# Patient Record
Sex: Female | Born: 1955 | Race: White | Hispanic: No | Smoking: Former smoker
Health system: Southern US, Community
[De-identification: ages and names within clinical notes are randomized; demographics above are authoritative.]

## PROBLEM LIST (undated history)

## (undated) DIAGNOSIS — M810 Age-related osteoporosis without current pathological fracture: Secondary | ICD-10-CM

## (undated) HISTORY — DX: Age-related osteoporosis without current pathological fracture: M81.0

## (undated) HISTORY — PX: NASAL SINUS SURGERY: SHX719

## (undated) HISTORY — PX: TUBAL LIGATION: SHX77

---

## 2015-01-16 ENCOUNTER — Telehealth: Payer: Self-pay | Admitting: Family Medicine

## 2015-03-07 ENCOUNTER — Ambulatory Visit (INDEPENDENT_AMBULATORY_CARE_PROVIDER_SITE_OTHER): Payer: BLUE CROSS/BLUE SHIELD

## 2015-03-07 ENCOUNTER — Ambulatory Visit (INDEPENDENT_AMBULATORY_CARE_PROVIDER_SITE_OTHER): Payer: BLUE CROSS/BLUE SHIELD | Admitting: Family Medicine

## 2015-03-07 ENCOUNTER — Encounter: Payer: Self-pay | Admitting: Family Medicine

## 2015-03-07 ENCOUNTER — Ambulatory Visit: Payer: BLUE CROSS/BLUE SHIELD

## 2015-03-07 VITALS — BP 106/65 | HR 60 | Temp 97.8°F | Ht 65.5 in | Wt 142.6 lb

## 2015-03-07 DIAGNOSIS — Z23 Encounter for immunization: Secondary | ICD-10-CM

## 2015-03-07 DIAGNOSIS — M79621 Pain in right upper arm: Secondary | ICD-10-CM

## 2015-03-07 DIAGNOSIS — M25529 Pain in unspecified elbow: Secondary | ICD-10-CM

## 2015-03-07 DIAGNOSIS — M79641 Pain in right hand: Secondary | ICD-10-CM

## 2015-03-07 DIAGNOSIS — M79602 Pain in left arm: Secondary | ICD-10-CM | POA: Diagnosis not present

## 2015-03-07 DIAGNOSIS — M79622 Pain in left upper arm: Secondary | ICD-10-CM

## 2015-03-07 DIAGNOSIS — M79601 Pain in right arm: Secondary | ICD-10-CM

## 2015-03-07 DIAGNOSIS — M79642 Pain in left hand: Secondary | ICD-10-CM
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
## 2015-03-11 ENCOUNTER — Telehealth: Payer: Self-pay | Admitting: Family Medicine

## 2015-03-11 NOTE — Telephone Encounter (Signed)
Pt would like to go to PT Please advise Please fax to [phone]

## 2015-03-11 NOTE — Telephone Encounter (Signed)
Stp and advised referral put in. Pt voiced understanding.

## 2015-03-11 NOTE — Telephone Encounter (Signed)
Please arrange for physical therapy

## 2015-03-14 ENCOUNTER — Ambulatory Visit (INDEPENDENT_AMBULATORY_CARE_PROVIDER_SITE_OTHER): Payer: BLUE CROSS/BLUE SHIELD | Admitting: Family Medicine

## 2015-03-14 ENCOUNTER — Ambulatory Visit (INDEPENDENT_AMBULATORY_CARE_PROVIDER_SITE_OTHER): Payer: BLUE CROSS/BLUE SHIELD

## 2015-03-14 ENCOUNTER — Encounter: Payer: Self-pay | Admitting: Family Medicine

## 2015-03-14 VITALS — BP 116/66 | HR 51 | Temp 97.2°F | Ht 65.5 in | Wt 147.0 lb

## 2015-03-14 DIAGNOSIS — M79603 Pain in arm, unspecified: Secondary | ICD-10-CM

## 2015-03-14 DIAGNOSIS — M79602 Pain in left arm: Principal | ICD-10-CM

## 2015-03-14 DIAGNOSIS — S134XXD Sprain of ligaments of cervical spine, subsequent encounter: Secondary | ICD-10-CM

## 2015-03-14 DIAGNOSIS — M545 Low back pain, unspecified: Secondary | ICD-10-CM

## 2015-03-14 DIAGNOSIS — M79601 Pain in right arm: Secondary | ICD-10-CM

## 2015-03-14 DIAGNOSIS — R202 Paresthesia of skin: Secondary | ICD-10-CM

## 2015-03-14 MED ORDER — HYDROCODONE-ACETAMINOPHEN 5-325 MG PO TABS: 1.0000 | ORAL_TABLET | Freq: Four times a day (QID) | ORAL | Status: DC | PRN
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
## 2015-03-17 ENCOUNTER — Telehealth: Payer: Self-pay | Admitting: Family Medicine

## 2015-03-17 NOTE — Telephone Encounter (Signed)
Stp and she says the hydrocodone made her very sick to her stomach and she is trying to find out the name of the pain medication she has taken in the past that didn't cause her to be sick. Pt states she will CB with the name of the medicine.
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
## 2015-03-21 ENCOUNTER — Encounter: Payer: Self-pay | Admitting: Family Medicine

## 2015-03-21 ENCOUNTER — Ambulatory Visit (INDEPENDENT_AMBULATORY_CARE_PROVIDER_SITE_OTHER): Payer: BLUE CROSS/BLUE SHIELD | Admitting: Family Medicine

## 2015-03-21 VITALS — BP 105/69 | HR 64 | Temp 97.4°F | Ht 66.0 in | Wt 140.0 lb

## 2015-03-21 DIAGNOSIS — S134XXD Sprain of ligaments of cervical spine, subsequent encounter: Secondary | ICD-10-CM | POA: Diagnosis not present

## 2015-03-21 DIAGNOSIS — M545 Low back pain, unspecified: Secondary | ICD-10-CM

## 2015-03-21 NOTE — Patient Instructions (Addendum)
Continue current plan of care.  Meds as on list below.  Start Physical therapy Limit activities to sedentary, but move around every hour to avoid stiffening. No lifting or bending activities.

## 2015-03-24 ENCOUNTER — Ambulatory Visit: Payer: BLUE CROSS/BLUE SHIELD | Attending: Family Medicine | Admitting: Physical Therapy

## 2015-03-24 DIAGNOSIS — M533 Sacrococcygeal disorders, not elsewhere classified: Secondary | ICD-10-CM | POA: Insufficient documentation

## 2015-03-24 DIAGNOSIS — M25512 Pain in left shoulder: Secondary | ICD-10-CM | POA: Insufficient documentation

## 2015-03-24 DIAGNOSIS — M25511 Pain in right shoulder: Secondary | ICD-10-CM | POA: Insufficient documentation

## 2015-03-24 DIAGNOSIS — M542 Cervicalgia: Secondary | ICD-10-CM
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
## 2015-03-26 ENCOUNTER — Other Ambulatory Visit: Payer: Self-pay

## 2015-03-26 ENCOUNTER — Telehealth: Payer: Self-pay | Admitting: Family Medicine

## 2015-03-26 ENCOUNTER — Ambulatory Visit: Payer: BLUE CROSS/BLUE SHIELD | Admitting: Physical Therapy

## 2015-03-26 ENCOUNTER — Encounter: Payer: Self-pay | Admitting: Physical Therapy

## 2015-03-26 DIAGNOSIS — M25511 Pain in right shoulder: Secondary | ICD-10-CM

## 2015-03-26 DIAGNOSIS — M25512 Pain in left shoulder: Secondary | ICD-10-CM

## 2015-03-26 DIAGNOSIS — M545 Low back pain: Secondary | ICD-10-CM

## 2015-03-26 DIAGNOSIS — M542 Cervicalgia: Secondary | ICD-10-CM | POA: Diagnosis not present

## 2015-03-26 NOTE — Therapy (Signed)
[facility] [place], [place], [address] Phone: [phone]   Fax:  [phone]  Physical Therapy Treatment  Patient Details  Name: Ashlee Maldonado MRN: [id number] Date of Birth: [birth year] Referring Provider: Claretta Fraise MD.  Encounter Date: 03/26/2015      PT End of Session - 03/26/15 0740    Visit Number 2   Number of Visits 16   Date for PT Re-Evaluation 05/19/15   PT Start Time 0737   PT Stop Time 0820   PT Time Calculation (min) 43 min   Activity Tolerance Patient tolerated treatment well   Behavior During Therapy [facility] for tasks assessed/performed      No past medical history on file.  Past Surgical History  Procedure Laterality Date  . Tubal ligation    . Nasal sinus surgery      There were no vitals filed for this visit.  Visit Diagnosis:  Neck pain  Bilateral shoulder pain      Subjective Assessment - 03/26/15 0733    Subjective States that chest and low back pain from position during previous treatment. Having more issues with low back.   Limitations House hold activities   Patient Stated Goals Get back to normal.   Currently in Pain? Yes   Pain Score 4    Pain Location Neck   Pain Orientation Right;Left   Pain Descriptors / Indicators Aching;Sore   Pain Onset 1 to 4 weeks ago   Pain Frequency Intermittent            [facility] PT Assessment - 03/26/15 0001    Assessment   Medical Diagnosis Bilateral shoulder pain.   Onset Date/Surgical Date 03/04/15   Next MD Visit 04/04/2015  [facility] Adult PT Treatment/Exercise - 03/26/15 0001    Exercises   Exercises Neck   Neck Exercises: Supine   Neck Retraction 15 reps   Neck Retraction Limitations Reported cervical pain   Upper Extremity D2 Flexion;20 reps;Theraband  x5 reps LUE   Theraband Level (UE D2) Level 1 (Yellow)   Other Supine Exercise chin tuck with B ER yellow band x30 reps   Modalities   Modalities Electrical Stimulation;Moist Heat   Moist Heat Therapy   Number Minutes Moist Heat 15 Minutes   Moist Heat Location Cervical   Electrical Stimulation   Electrical Stimulation Location B cervical paraspinals/ Levator Scapula   Electrical Stimulation Action Pre-Mod   Electrical Stimulation Parameters 80-150 Hz x15 min   Electrical Stimulation Goals Pain;Tone   Manual Therapy   Manual Therapy Myofascial release   Myofascial Release MFR to B Levator Scapula/ cervical paraspinals to decrease tightness and pain in sitting                PT Education - 03/26/15 0901    Education provided Yes   Education Details HEP- chin tuck in supine with pillow in supine   Person(s) Educated Patient   Methods Explanation;Verbal cues;Handout   Comprehension Verbalized understanding;Verbal cues required             PT Long Term Goals - 03/24/15 1012    PT LONG TERM GOAL #1   Title Independent with an HEP.   Time 8   Period Weeks   Status New   PT LONG TERM GOAL #2   Title Bilateral active cervical rotation= 80 degrees.   Time 8  Period Weeks   Status New   PT LONG TERM GOAL #3   Title Perform ADL's with pain not > 3/10.   Time 8   Period Weeks   Status New               Plan - 03/26/15 0813    Clinical Impression Statement Patient tolerated todays's treatment fairly well today although she experienced pain with chin tucks in supine with pillow and in anterior L Bicep where bruising is still present during D2 exercise. Completed all exercises with fairly good technique that will require further cuieng. Main compliant during treatment today was of low back pain per patient report. Pillow was placed under buttocks while patient was in supine to provide relief. Normal modalities response noted following removal of the modalities. Accepted new HEP regarding chin tuck into pillow with directions regarding technique and parameters without questions. Denied cervical pain  following today' s treatment.   Pt will benefit from skilled therapeutic intervention in order to improve on the following deficits Pain;Decreased range of motion   Rehab Potential Excellent   PT Frequency 2x / week   PT Duration 8 weeks   PT Treatment/Interventions ADLs/Self Care Home Management;Electrical Stimulation;Cryotherapy;Moist Heat;Therapeutic exercise;Therapeutic activities;Ultrasound;Patient/family education;Manual techniques;Passive range of motion   PT Next Visit Plan Mckenzie neck exercises; Modalities and STW/M to bilateral cervical musculature (levator scap release technique).   PT Home Exercise Plan Provide additional HEP next treatment.   Consulted and Agree with Plan of Care Patient        Problem List There are no active problems to display for this patient.   Wynelle Fanny, PTA 03/26/2015, 9:06 AM  [facility] [address] [place], [place], [address] Phone: [phone]   Fax:  [phone]  Name: Ashlee Maldonado MRN: [id number] Date of Birth: [birth year]

## 2015-03-26 NOTE — Patient Instructions (Signed)
AROM: Neck Extension    Bend head backward. Hold _3___ seconds. Repeat __25-30__ times per set. Do _1___ sets per session. Do __2-3__ sessions per day.  http://orth.exer.us/300   Copyright  VHI. All rights reserved.

## 2015-04-01 ENCOUNTER — Encounter: Payer: Self-pay | Admitting: Physical Therapy

## 2015-04-01 ENCOUNTER — Ambulatory Visit: Payer: BLUE CROSS/BLUE SHIELD | Admitting: Physical Therapy

## 2015-04-01 DIAGNOSIS — M542 Cervicalgia: Secondary | ICD-10-CM

## 2015-04-01 DIAGNOSIS — M25511 Pain in right shoulder: Secondary | ICD-10-CM

## 2015-04-01 DIAGNOSIS — M25512 Pain in left shoulder: Secondary | ICD-10-CM

## 2015-04-01 NOTE — Therapy (Signed)
[facility] [place], [place], [address] Phone: [phone]   Fax:  [phone]  Physical Therapy Treatment  Patient Details  Name: Ashlee Maldonado MRN: [id number] Date of Birth: [birth year] Referring Provider: Claretta Fraise MD.  Encounter Date: 04/01/2015      PT End of Session - 04/01/15 0733    Visit Number 3   Number of Visits 16   Date for PT Re-Evaluation 05/19/15   PT Start Time 0734   PT Stop Time 0820   PT Time Calculation (min) 46 min   Activity Tolerance Patient tolerated treatment well   Behavior During Therapy [facility] for tasks assessed/performed      No past medical history on file.  Past Surgical History  Procedure Laterality Date  . Tubal ligation    . Nasal sinus surgery      There were no vitals filed for this visit.  Visit Diagnosis:  Neck pain  Bilateral shoulder pain      Subjective Assessment - 04/01/15 0731    Subjective States that she is sore from being on the couch all weekend and was sick and vomiting all weekend.    Limitations House hold activities   Patient Stated Goals Get back to normal.   Currently in Pain? Yes   Pain Score 8    Pain Location Neck   Pain Orientation Right;Left   Pain Descriptors / Indicators Sore   Pain Onset 1 to 4 weeks ago            [facility] PT Assessment - 04/01/15 0001    Assessment   Medical Diagnosis Bilateral shoulder pain.   Onset Date/Surgical Date 03/04/15   Next MD Visit 04/04/2015   Precautions   Precautions None                     [facility] Adult PT Treatment/Exercise - 04/01/15 0001    Modalities   Modalities Electrical Stimulation;Moist Heat;Ultrasound   Moist Heat Therapy   Number Minutes Moist Heat 15 Minutes   Moist Heat Location Cervical   Electrical Stimulation   Electrical Stimulation Location B cervical paraspinals/ Levator Scapula   Electrical Stimulation Action IFC   Electrical Stimulation Parameters 80-150 hz  x15 min   Electrical Stimulation Goals Pain;Tone   Ultrasound   Ultrasound Location B UT/Levator Scapula/ cervical paraspinals   Ultrasound Parameters 1.5 w/cm2, 100%, 39mhz x10 min   Ultrasound Goals Pain   Manual Therapy   Manual Therapy Myofascial release   Myofascial Release MFR to B Upper Trapezius/Levator Scapula/ cervical paraspinals to decrease tightness and pain in sitting                     PT Long Term Goals - 04/01/15 0806    PT LONG TERM GOAL #1   Title Independent with an HEP.   Time 8   Period Weeks   Status Achieved   PT LONG TERM GOAL #2   Title Bilateral active cervical rotation= 80 degrees.   Time 8   Period Weeks   Status On-going   PT LONG TERM GOAL #3   Title Perform ADL's with pain not > 3/10.   Time 8   Period Weeks   Status On-going               Plan - 04/01/15 0810    Clinical Impression Statement Patient tolerated today's treatment fairly well although she noted she was more sore following a weekend battling  a virus/stomach bug. Conservative treatment was conducted today to allow patient to recover more from illness before initating more exercise since she was still weak. Continues to be tight throughout B UT/ Levator Scapula region into cervical paraspinals that was palpable during manual therapy. Normal modalities response noted following removal of the modalities. Experienced "feeling better" upon end of treatment.   Pt will benefit from skilled therapeutic intervention in order to improve on the following deficits Pain;Decreased range of motion   Rehab Potential Excellent   PT Frequency 2x / week   PT Duration 8 weeks   PT Treatment/Interventions ADLs/Self Care Home Management;Electrical Stimulation;Cryotherapy;Moist Heat;Therapeutic exercise;Therapeutic activities;Ultrasound;Patient/family education;Manual techniques;Passive range of motion   PT Next Visit Plan Mckenzie neck exercises; Modalities and STW/M to bilateral  cervical musculature (levator scap release technique).   PT Home Exercise Plan Provide additional HEP next treatment.   Consulted and Agree with Plan of Care Patient        Problem List There are no active problems to display for this patient.  [name], PTA 04/01/2015 9:27 AM  [facility] [address] [place], [place], [address] Phone: [phone]   Fax:  [phone]  Name: Ashlee Maldonado MRN: [id number] Date of Birth: 08/06/1955

## 2015-04-03 ENCOUNTER — Encounter: Payer: Self-pay | Admitting: Physical Therapy

## 2015-04-03 ENCOUNTER — Ambulatory Visit: Payer: BLUE CROSS/BLUE SHIELD | Admitting: Physical Therapy

## 2015-04-03 DIAGNOSIS — M542 Cervicalgia: Secondary | ICD-10-CM

## 2015-04-03 DIAGNOSIS — M25511 Pain in right shoulder: Secondary | ICD-10-CM

## 2015-04-03 DIAGNOSIS — M25512 Pain in left shoulder: Secondary | ICD-10-CM

## 2015-04-03 NOTE — Therapy (Signed)
[facility] [place], [place], [address] Phone: [phone]   Fax:  [phone]  Physical Therapy Treatment  Patient Details  Name: Ashlee Maldonado MRN: [id number] Date of Birth: [birth year] Referring Provider: Claretta Fraise MD.  Encounter Date: 04/03/2015      PT End of Session - 04/03/15 0807    Visit Number 4   Number of Visits 16   Date for PT Re-Evaluation 05/19/15   PT Start Time 0734   PT Stop Time 0820   PT Time Calculation (min) 46 min   Activity Tolerance Patient tolerated treatment well   Behavior During Therapy [facility] for tasks assessed/performed      No past medical history on file.  Past Surgical History  Procedure Laterality Date  . Tubal ligation    . Nasal sinus surgery      There were no vitals filed for this visit.  Visit Diagnosis:  Neck pain  Bilateral shoulder pain      Subjective Assessment - 04/03/15 0806    Subjective Reports she cannot get comfortable at night sleeping and didn't sleep well last night. Reports some numbness and tingling.   Limitations House hold activities   Patient Stated Goals Get back to normal.   Currently in Pain? Yes   Pain Score 4    Pain Location Neck   Pain Orientation Right;Left   Pain Descriptors / Indicators Sore;Aching   Pain Onset 1 to 4 weeks ago            [facility] PT Assessment - 04/03/15 0001    Assessment   Medical Diagnosis Bilateral shoulder pain.   Onset Date/Surgical Date 03/04/15   Next MD Visit 04/04/2015   Precautions   Precautions None                     [facility] Adult PT Treatment/Exercise - 04/03/15 0001    Modalities   Modalities Electrical Stimulation;Moist Heat;Ultrasound   Moist Heat Therapy   Number Minutes Moist Heat 15 Minutes   Moist Heat Location Cervical   Electrical Stimulation   Electrical Stimulation Location B cervical paraspinals/ Levator Scapula   Electrical Stimulation Action IFC   Electrical  Stimulation Parameters 1-10 Hz x15 min   Electrical Stimulation Goals Pain;Tone   Ultrasound   Ultrasound Location B UT/ Levator Scapula   Ultrasound Parameters 1.5 w/cm2, 100%,1 mhz x10 min   Ultrasound Goals Pain   Manual Therapy   Manual Therapy Myofascial release   Myofascial Release MFR to B Upper Trapezius/Levator Scapula/ cervical paraspinals to decrease tightness and pain in sitting                     PT Long Term Goals - 04/01/15 0806    PT LONG TERM GOAL #1   Title Independent with an HEP.   Time 8   Period Weeks   Status Achieved   PT LONG TERM GOAL #2   Title Bilateral active cervical rotation= 80 degrees.   Time 8   Period Weeks   Status On-going   PT LONG TERM GOAL #3   Title Perform ADL's with pain not > 3/10.   Time 8   Period Weeks   Status On-going               Plan - 04/03/15 0810    Clinical Impression Statement Patient tolerated today's treatment well today with no reports of tenderness or soreness. Tightness presented more in R Levator  Scapula than in L side musculature today during manual therapy. Patient has difficulty with sleepig and finding comfortable position per patient report. Goals remain on-going secondary to pain with ADLs and deficit in cervical ROM. Normal modalities response noted following removal of the modalities. Experienced neck "feeling better" following today's treatment but low back was 8/10 while laying in hooklying during modaliites.   Pt will benefit from skilled therapeutic intervention in order to improve on the following deficits Pain;Decreased range of motion   Rehab Potential Excellent   PT Frequency 2x / week   PT Duration 8 weeks   PT Treatment/Interventions ADLs/Self Care Home Management;Electrical Stimulation;Cryotherapy;Moist Heat;Therapeutic exercise;Therapeutic activities;Ultrasound;Patient/family education;Manual techniques;Passive range of motion   PT Next Visit Plan Mckenzie neck exercises;  Modalities and STW/M to bilateral cervical musculature (levator scap release technique).   Consulted and Agree with Plan of Care Patient        Problem List There are no active problems to display for this patient.   [name], PTA 04/03/2015 9:02 AM  [facility] [place], [place], [address] Phone: [phone]   Fax:  [phone]  Name: Ashlee Maldonado MRN: [id number] Date of Birth: 21-Jun-1955

## 2015-04-04 ENCOUNTER — Ambulatory Visit (INDEPENDENT_AMBULATORY_CARE_PROVIDER_SITE_OTHER): Payer: BLUE CROSS/BLUE SHIELD | Admitting: Family Medicine

## 2015-04-04 ENCOUNTER — Encounter: Payer: Self-pay | Admitting: Family Medicine

## 2015-04-04 DIAGNOSIS — M79601 Pain in right arm: Secondary | ICD-10-CM | POA: Diagnosis not present

## 2015-04-04 DIAGNOSIS — M545 Low back pain: Secondary | ICD-10-CM

## 2015-04-04 MED ORDER — ONDANSETRON HCL 4 MG PO TABS: 4.0000 mg | ORAL_TABLET | Freq: Three times a day (TID) | ORAL | Status: DC | PRN

## 2015-04-04 MED ORDER — CYCLOBENZAPRINE HCL 10 MG PO TABS: 5.0000 mg | ORAL_TABLET | Freq: Three times a day (TID) | ORAL | Status: DC

## 2015-04-04 NOTE — Patient Instructions (Signed)
Great tto meet you!  If you have any persistent numbness, especially if it is accompanied by weakness then please come back right away.   Lets plan to follow up with either Dr. Livia Snellen or myself in 1 month.   Continue meloxicam as you are, if you would like a lower potency medicine aleve would be a good choice.
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
## 2015-04-09 ENCOUNTER — Ambulatory Visit: Payer: BLUE CROSS/BLUE SHIELD | Admitting: Physical Therapy

## 2015-04-09 DIAGNOSIS — M542 Cervicalgia: Secondary | ICD-10-CM | POA: Diagnosis not present

## 2015-04-09 DIAGNOSIS — M25511 Pain in right shoulder: Secondary | ICD-10-CM

## 2015-04-09 DIAGNOSIS — M533 Sacrococcygeal disorders, not elsewhere classified: Secondary | ICD-10-CM

## 2015-04-09 DIAGNOSIS — M25512 Pain in left shoulder: Secondary | ICD-10-CM
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
## 2015-04-15 ENCOUNTER — Ambulatory Visit: Payer: BLUE CROSS/BLUE SHIELD | Admitting: Physical Therapy

## 2015-04-15 ENCOUNTER — Encounter: Payer: Self-pay | Admitting: Physical Therapy

## 2015-04-15 DIAGNOSIS — M25512 Pain in left shoulder: Secondary | ICD-10-CM

## 2015-04-15 DIAGNOSIS — M542 Cervicalgia: Secondary | ICD-10-CM

## 2015-04-15 DIAGNOSIS — M533 Sacrococcygeal disorders, not elsewhere classified: Secondary | ICD-10-CM

## 2015-04-15 DIAGNOSIS — M25511 Pain in right shoulder: Secondary | ICD-10-CM
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
## 2015-04-17 ENCOUNTER — Encounter: Payer: BLUE CROSS/BLUE SHIELD | Admitting: Physical Therapy

## 2015-04-22 ENCOUNTER — Encounter: Payer: Self-pay | Admitting: Physical Therapy

## 2015-04-22 ENCOUNTER — Ambulatory Visit: Payer: BLUE CROSS/BLUE SHIELD | Attending: Family Medicine | Admitting: Physical Therapy

## 2015-04-22 DIAGNOSIS — M533 Sacrococcygeal disorders, not elsewhere classified: Secondary | ICD-10-CM | POA: Diagnosis present

## 2015-04-22 DIAGNOSIS — M25512 Pain in left shoulder: Secondary | ICD-10-CM | POA: Diagnosis present

## 2015-04-22 DIAGNOSIS — M542 Cervicalgia: Secondary | ICD-10-CM | POA: Insufficient documentation

## 2015-04-22 DIAGNOSIS — M25511 Pain in right shoulder: Secondary | ICD-10-CM | POA: Diagnosis present
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
## 2015-04-24 ENCOUNTER — Encounter: Payer: Self-pay | Admitting: Physical Therapy

## 2015-04-24 ENCOUNTER — Ambulatory Visit: Payer: BLUE CROSS/BLUE SHIELD | Admitting: Physical Therapy

## 2015-04-24 DIAGNOSIS — M25511 Pain in right shoulder: Secondary | ICD-10-CM

## 2015-04-24 DIAGNOSIS — M25512 Pain in left shoulder: Secondary | ICD-10-CM

## 2015-04-24 DIAGNOSIS — M533 Sacrococcygeal disorders, not elsewhere classified: Secondary | ICD-10-CM

## 2015-04-24 DIAGNOSIS — M542 Cervicalgia: Secondary | ICD-10-CM
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
## 2015-04-29 ENCOUNTER — Encounter: Payer: BLUE CROSS/BLUE SHIELD | Admitting: Physical Therapy

## 2015-05-01 ENCOUNTER — Encounter: Payer: Self-pay | Admitting: Physical Therapy

## 2015-05-01 ENCOUNTER — Ambulatory Visit: Payer: BLUE CROSS/BLUE SHIELD | Admitting: Physical Therapy

## 2015-05-01 DIAGNOSIS — M25512 Pain in left shoulder: Secondary | ICD-10-CM

## 2015-05-01 DIAGNOSIS — M542 Cervicalgia: Secondary | ICD-10-CM

## 2015-05-01 DIAGNOSIS — M533 Sacrococcygeal disorders, not elsewhere classified: Secondary | ICD-10-CM

## 2015-05-01 DIAGNOSIS — M25511 Pain in right shoulder: Secondary | ICD-10-CM
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
## 2015-05-05 ENCOUNTER — Ambulatory Visit: Payer: BLUE CROSS/BLUE SHIELD | Admitting: Family Medicine

## 2015-05-06 ENCOUNTER — Ambulatory Visit: Payer: BLUE CROSS/BLUE SHIELD | Admitting: Physical Therapy

## 2015-05-06 ENCOUNTER — Encounter: Payer: Self-pay | Admitting: Physical Therapy

## 2015-05-06 DIAGNOSIS — M533 Sacrococcygeal disorders, not elsewhere classified: Secondary | ICD-10-CM

## 2015-05-06 DIAGNOSIS — M25511 Pain in right shoulder: Secondary | ICD-10-CM

## 2015-05-06 DIAGNOSIS — M542 Cervicalgia: Secondary | ICD-10-CM | POA: Diagnosis not present

## 2015-05-06 DIAGNOSIS — M25512 Pain in left shoulder: Secondary | ICD-10-CM
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
## 2015-05-06 NOTE — Patient Instructions (Signed)
Stretching: Piriformis    Cross left leg over other thigh and place elbow over outside of knee. Gently stretch buttock muscles by pushing bent knee across body. Hold __30__ seconds. Repeat __3__ times per set. Do __1__ sets per session. Do __2-3__ sessions per day.  http://orth.exer.us/650   Copyright  VHI. All rights reserved.

## 2015-05-09 ENCOUNTER — Encounter: Payer: Self-pay | Admitting: Family Medicine

## 2015-05-09 ENCOUNTER — Ambulatory Visit (INDEPENDENT_AMBULATORY_CARE_PROVIDER_SITE_OTHER): Payer: BLUE CROSS/BLUE SHIELD | Admitting: Family Medicine

## 2015-05-09 VITALS — BP 111/64 | HR 62 | Temp 97.0°F | Ht 66.0 in | Wt 144.0 lb

## 2015-05-09 DIAGNOSIS — S134XXD Sprain of ligaments of cervical spine, subsequent encounter: Secondary | ICD-10-CM | POA: Diagnosis not present

## 2015-05-09 DIAGNOSIS — M545 Low back pain, unspecified: Secondary | ICD-10-CM

## 2015-05-09 MED ORDER — ONDANSETRON 8 MG PO TBDP: 8.0000 mg | ORAL_TABLET | Freq: Four times a day (QID) | ORAL | Status: DC | PRN
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
## 2015-05-09 NOTE — Patient Instructions (Addendum)
Continue Physical Therapy. Consider increase to twice weekly. Do your exercises at least twice daily. Follow up will be with orthopedics.

## 2015-05-13 ENCOUNTER — Encounter: Payer: BLUE CROSS/BLUE SHIELD | Admitting: Physical Therapy

## 2015-05-20 ENCOUNTER — Telehealth: Payer: Self-pay | Admitting: Family Medicine

## 2015-05-20 ENCOUNTER — Encounter: Payer: Self-pay | Admitting: Physical Therapy

## 2015-05-20 ENCOUNTER — Ambulatory Visit: Payer: BLUE CROSS/BLUE SHIELD | Admitting: Physical Therapy

## 2015-05-20 DIAGNOSIS — M542 Cervicalgia: Secondary | ICD-10-CM | POA: Diagnosis not present

## 2015-05-20 DIAGNOSIS — M533 Sacrococcygeal disorders, not elsewhere classified: Secondary | ICD-10-CM

## 2015-05-20 DIAGNOSIS — M25512 Pain in left shoulder: Secondary | ICD-10-CM

## 2015-05-20 DIAGNOSIS — M25511 Pain in right shoulder: Secondary | ICD-10-CM
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
## 2015-05-27 ENCOUNTER — Encounter: Payer: Self-pay | Admitting: Physical Therapy

## 2015-05-27 ENCOUNTER — Ambulatory Visit: Payer: BLUE CROSS/BLUE SHIELD | Attending: Family Medicine | Admitting: Physical Therapy

## 2015-05-27 DIAGNOSIS — M25512 Pain in left shoulder: Secondary | ICD-10-CM | POA: Insufficient documentation

## 2015-05-27 DIAGNOSIS — M542 Cervicalgia: Secondary | ICD-10-CM

## 2015-05-27 DIAGNOSIS — M25511 Pain in right shoulder: Secondary | ICD-10-CM

## 2015-05-27 DIAGNOSIS — M533 Sacrococcygeal disorders, not elsewhere classified: Secondary | ICD-10-CM

## 2015-05-27 NOTE — Addendum Note (Signed)
Addended by: APPLEGATE, Mali W on: 05/27/2015 03:47 PM   Modules accepted: Orders
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
## 2015-06-03 ENCOUNTER — Ambulatory Visit: Payer: BLUE CROSS/BLUE SHIELD | Admitting: Physical Therapy

## 2015-06-03 ENCOUNTER — Encounter: Payer: Self-pay | Admitting: Physical Therapy

## 2015-06-03 DIAGNOSIS — M533 Sacrococcygeal disorders, not elsewhere classified: Secondary | ICD-10-CM

## 2015-06-03 DIAGNOSIS — M25511 Pain in right shoulder: Secondary | ICD-10-CM

## 2015-06-03 DIAGNOSIS — M25512 Pain in left shoulder: Secondary | ICD-10-CM

## 2015-06-03 DIAGNOSIS — M542 Cervicalgia: Secondary | ICD-10-CM
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
## 2015-06-10 ENCOUNTER — Encounter: Payer: Self-pay | Admitting: Physical Therapy

## 2015-06-10 ENCOUNTER — Ambulatory Visit: Payer: BLUE CROSS/BLUE SHIELD | Admitting: Physical Therapy

## 2015-06-10 DIAGNOSIS — M542 Cervicalgia: Secondary | ICD-10-CM | POA: Diagnosis not present

## 2015-06-10 DIAGNOSIS — M533 Sacrococcygeal disorders, not elsewhere classified: Secondary | ICD-10-CM

## 2015-06-10 DIAGNOSIS — M25511 Pain in right shoulder: Secondary | ICD-10-CM

## 2015-06-10 DIAGNOSIS — M25512 Pain in left shoulder: Secondary | ICD-10-CM
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
## 2015-06-13 ENCOUNTER — Telehealth: Payer: Self-pay | Admitting: Family Medicine

## 2015-06-17 ENCOUNTER — Encounter: Payer: Self-pay | Admitting: Physical Therapy

## 2015-06-17 ENCOUNTER — Ambulatory Visit: Payer: BLUE CROSS/BLUE SHIELD | Admitting: Physical Therapy

## 2015-06-17 DIAGNOSIS — M25512 Pain in left shoulder: Secondary | ICD-10-CM

## 2015-06-17 DIAGNOSIS — M533 Sacrococcygeal disorders, not elsewhere classified: Secondary | ICD-10-CM

## 2015-06-17 DIAGNOSIS — M25511 Pain in right shoulder: Secondary | ICD-10-CM

## 2015-06-17 DIAGNOSIS — M542 Cervicalgia: Secondary | ICD-10-CM

## 2015-06-17 NOTE — Telephone Encounter (Signed)
Pt reported
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
## 2015-06-24 ENCOUNTER — Encounter: Payer: Self-pay | Admitting: Physical Therapy

## 2015-06-24 ENCOUNTER — Ambulatory Visit: Payer: BLUE CROSS/BLUE SHIELD | Attending: Family Medicine | Admitting: Physical Therapy

## 2015-06-24 DIAGNOSIS — M25512 Pain in left shoulder: Secondary | ICD-10-CM | POA: Diagnosis present

## 2015-06-24 DIAGNOSIS — M542 Cervicalgia: Secondary | ICD-10-CM | POA: Diagnosis not present

## 2015-06-24 DIAGNOSIS — M533 Sacrococcygeal disorders, not elsewhere classified: Secondary | ICD-10-CM | POA: Diagnosis present

## 2015-06-24 DIAGNOSIS — M25511 Pain in right shoulder: Secondary | ICD-10-CM | POA: Diagnosis present
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
## 2015-06-24 NOTE — Patient Instructions (Signed)
Flexibility: Upper Trapezius Stretch    Gently grasp right side of head while reaching behind back with other hand. Tilt head away until a gentle stretch is felt. Hold __30__ seconds. Repeat __3__ times per set. Do __1__ sets per session. Do __2-3__ sessions per day.  http://orth.exer.us/340   Copyright  VHI. All rights reserved.  Levator Scapula Stretch    Place left hand on same side shoulder blade. With other hand, gently stretch head down and away. Hold __ 30__ seconds. Repeat __3__ times per set. Do _1___ sets per session. Do __2-3__ sessions per day.  http://orth.exer.us/348   Copyright  VHI. All rights reserved.  Scapular Retraction: Abduction (Standing)    With arms elevated and elbows bent to 90, pinch shoulder blades together and press arms back. Repeat _10___ times per set. Do _2-3___ sets per session. Do _2-3___ sessions per day.  http://orth.exer.us/950   Copyright  VHI. All rights reserved.  Bridging    Slowly raise buttocks from floor, keeping stomach tight. Repeat __10__ times per set. Do __2-3__ sets per session. Do _2-3___ sessions per day.  http://orth.exer.us/1096   Copyright  VHI. All rights reserved.  Knee-to-Chest Stretch: Unilateral    With hand behind right knee, pull knee in to chest until a comfortable stretch is felt in lower back and buttocks. Keep back relaxed. Hold ____ seconds. Repeat _3___ times per set. Do _1___ sets per session. Do _2-3___ sessions per day.  http://orth.exer.us/126   Copyright  VHI. All rights reserved.  Stretching: Hip Flexor    Kneeling on right knee, slowly push pelvis down while slightly arching back until stretch is felt on front of hip. Hold ____ seconds. Repeat __3__ times per set. Do __1__ sets per session. Do __2-3__ sessions per day.  http://orth.exer.us/648   Copyright  VHI. All rights reserved.  Strengthening: Hip Abduction (Side-Lying)    Tighten muscles on front of left thigh,  then lift leg ____ inches from surface, keeping knee locked.  Repeat _10___ times per set. Do _2-3___ sets per session. Do _2-3___ sessions per day.  http://orth.exer.us/622   Copyright  VHI. All rights reserved.

## 2017-04-21 DIAGNOSIS — Z1231 Encounter for screening mammogram for malignant neoplasm of breast: Secondary | ICD-10-CM | POA: Diagnosis not present

## 2017-04-21 DIAGNOSIS — Z6824 Body mass index (BMI) 24.0-24.9, adult: Secondary | ICD-10-CM | POA: Diagnosis not present

## 2017-04-21 DIAGNOSIS — Z01419 Encounter for gynecological examination (general) (routine) without abnormal findings: Secondary | ICD-10-CM | POA: Diagnosis not present

## 2017-07-18 DIAGNOSIS — H6981 Other specified disorders of Eustachian tube, right ear: Secondary | ICD-10-CM | POA: Diagnosis not present

## 2017-07-18 DIAGNOSIS — J0100 Acute maxillary sinusitis, unspecified: Secondary | ICD-10-CM | POA: Diagnosis not present
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
## 2018-05-22 DIAGNOSIS — Z01419 Encounter for gynecological examination (general) (routine) without abnormal findings: Secondary | ICD-10-CM | POA: Diagnosis not present

## 2018-05-22 DIAGNOSIS — Z6825 Body mass index (BMI) 25.0-25.9, adult: Secondary | ICD-10-CM | POA: Diagnosis not present

## 2018-05-22 DIAGNOSIS — Z1231 Encounter for screening mammogram for malignant neoplasm of breast: Secondary | ICD-10-CM | POA: Diagnosis not present

## 2018-07-05 ENCOUNTER — Other Ambulatory Visit: Payer: Self-pay

## 2018-07-05 ENCOUNTER — Ambulatory Visit: Payer: BLUE CROSS/BLUE SHIELD | Admitting: Family Medicine

## 2018-07-05 DIAGNOSIS — J309 Allergic rhinitis, unspecified: Secondary | ICD-10-CM | POA: Diagnosis not present

## 2018-07-05 DIAGNOSIS — K529 Noninfective gastroenteritis and colitis, unspecified: Secondary | ICD-10-CM | POA: Diagnosis not present

## 2018-09-22 ENCOUNTER — Ambulatory Visit: Payer: BC Managed Care – PPO | Admitting: Family Medicine

## 2018-09-22 ENCOUNTER — Other Ambulatory Visit: Payer: Self-pay

## 2018-09-22 ENCOUNTER — Ambulatory Visit (INDEPENDENT_AMBULATORY_CARE_PROVIDER_SITE_OTHER): Payer: BC Managed Care – PPO

## 2018-09-22 ENCOUNTER — Encounter: Payer: Self-pay | Admitting: Family Medicine

## 2018-09-22 VITALS — BP 129/76 | HR 65 | Temp 98.1°F | Ht 66.0 in | Wt 155.0 lb

## 2018-09-22 DIAGNOSIS — M25551 Pain in right hip: Secondary | ICD-10-CM

## 2018-09-22 DIAGNOSIS — Z7689 Persons encountering health services in other specified circumstances: Secondary | ICD-10-CM | POA: Diagnosis not present
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
## 2018-09-22 NOTE — Patient Instructions (Signed)

## 2018-09-28 ENCOUNTER — Other Ambulatory Visit: Payer: Self-pay

## 2018-09-28 ENCOUNTER — Ambulatory Visit: Payer: BC Managed Care – PPO | Attending: Family Medicine | Admitting: Physical Therapy

## 2018-09-28 ENCOUNTER — Encounter: Payer: Self-pay | Admitting: Physical Therapy

## 2018-09-28 DIAGNOSIS — M25551 Pain in right hip: Secondary | ICD-10-CM | POA: Diagnosis not present

## 2018-09-28 DIAGNOSIS — R262 Difficulty in walking, not elsewhere classified: Secondary | ICD-10-CM | POA: Diagnosis not present

## 2018-09-28 DIAGNOSIS — M6281 Muscle weakness (generalized): Secondary | ICD-10-CM | POA: Insufficient documentation
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
## 2018-10-03 ENCOUNTER — Other Ambulatory Visit: Payer: Self-pay

## 2018-10-03 ENCOUNTER — Ambulatory Visit: Payer: BC Managed Care – PPO | Admitting: Physical Therapy

## 2018-10-03 ENCOUNTER — Encounter: Payer: Self-pay | Admitting: Physical Therapy

## 2018-10-03 DIAGNOSIS — M6281 Muscle weakness (generalized): Secondary | ICD-10-CM

## 2018-10-03 DIAGNOSIS — R262 Difficulty in walking, not elsewhere classified: Secondary | ICD-10-CM | POA: Diagnosis not present

## 2018-10-03 DIAGNOSIS — M25551 Pain in right hip: Secondary | ICD-10-CM
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
## 2018-10-05 ENCOUNTER — Other Ambulatory Visit: Payer: Self-pay

## 2018-10-05 ENCOUNTER — Ambulatory Visit: Payer: BC Managed Care – PPO | Admitting: Physical Therapy

## 2018-10-05 ENCOUNTER — Encounter: Payer: Self-pay | Admitting: Physical Therapy

## 2018-10-05 DIAGNOSIS — M6281 Muscle weakness (generalized): Secondary | ICD-10-CM | POA: Diagnosis not present

## 2018-10-05 DIAGNOSIS — R262 Difficulty in walking, not elsewhere classified: Secondary | ICD-10-CM | POA: Diagnosis not present

## 2018-10-05 DIAGNOSIS — M25551 Pain in right hip: Secondary | ICD-10-CM | POA: Diagnosis not present
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
## 2018-10-09 ENCOUNTER — Ambulatory Visit: Payer: BC Managed Care – PPO | Admitting: Physical Therapy

## 2018-10-09 ENCOUNTER — Other Ambulatory Visit: Payer: Self-pay

## 2018-10-09 ENCOUNTER — Encounter: Payer: Self-pay | Admitting: Physical Therapy

## 2018-10-09 DIAGNOSIS — M25551 Pain in right hip: Secondary | ICD-10-CM | POA: Diagnosis not present

## 2018-10-09 DIAGNOSIS — R262 Difficulty in walking, not elsewhere classified: Secondary | ICD-10-CM

## 2018-10-09 DIAGNOSIS — M6281 Muscle weakness (generalized): Secondary | ICD-10-CM | POA: Diagnosis not present
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
## 2018-10-12 ENCOUNTER — Encounter: Payer: Self-pay | Admitting: Physical Therapy

## 2018-10-12 ENCOUNTER — Other Ambulatory Visit: Payer: Self-pay

## 2018-10-12 ENCOUNTER — Ambulatory Visit: Payer: BC Managed Care – PPO | Admitting: Physical Therapy

## 2018-10-12 DIAGNOSIS — M25551 Pain in right hip: Secondary | ICD-10-CM | POA: Diagnosis not present

## 2018-10-12 DIAGNOSIS — M6281 Muscle weakness (generalized): Secondary | ICD-10-CM

## 2018-10-12 DIAGNOSIS — R262 Difficulty in walking, not elsewhere classified: Secondary | ICD-10-CM
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
## 2018-10-19 ENCOUNTER — Ambulatory Visit: Payer: BC Managed Care – PPO | Attending: Family Medicine | Admitting: Physical Therapy

## 2018-10-19 ENCOUNTER — Other Ambulatory Visit: Payer: Self-pay

## 2018-10-19 DIAGNOSIS — M25551 Pain in right hip: Secondary | ICD-10-CM

## 2018-10-19 DIAGNOSIS — R262 Difficulty in walking, not elsewhere classified: Secondary | ICD-10-CM | POA: Insufficient documentation

## 2018-10-19 DIAGNOSIS — M6281 Muscle weakness (generalized): Secondary | ICD-10-CM | POA: Insufficient documentation

## 2018-10-19 NOTE — Patient Instructions (Signed)
[facility]  DRY NEEDLING CONSENT FORM   Trigger point dry needling is a physical therapy approach to treat Myofascial Pain and Dysfunction.  Dry Needling (DN) is a valuable and effective way to deactivate myofascial trigger points (muscle knots/pain). It is skilled intervention that uses a thin filiform needle to penetrate the skin and stimulate underlying myofascial trigger points, muscular, and connective tissues for the management of neuromusculoskeletal pain and movement impairments.  A local twitch response (LTR) will be elicited.  This can sometimes feel like a deep ache in the muscle during the procedure. Multiple trigger points in multiple muscles can be treated during each treatment.  No medication of any kind is injected.   As with any medical treatment and procedure, there are possible adverse events.  While significant adverse events are uncommon, they do sometimes occur and must be considered prior to giving consent.  1. Dry needling often causes a "post needling soreness".  There can be an increase in pain from a couple of hours to 2-3 days, followed by an improvement in the overall pain state. 2. Any time a needle is used there is a risk of infection.  However, we are using new, sterile, and disposable needles; infections are extremely rare. 3. There is a possibility that you may bleed or bruise.  You may feel tired and some nausea following treatment. 4. There is a rare possibility of a pneumothorax (air in the chest cavity). 5. Allergic reaction to nickel in the stainless steel needle. 6. If a nerve is touched, it may cause paresthesia (a prickling/shock sensation) which is usually brief, but may continue for a couple of days.  Following treatment stay hydrated.  Continue regular activities but not too vigorous initially after treatment for 24-48 hours.  Dry Needling is best when combined with other physical therapy interventions such as  strengthening, stretching and other therapeutic modalities.   PLEASE ANSWER THE FOLLOWING QUESTIONS:  Do you have a lack of sensation?   Y/N  Do you have a phobia or fear of needles  Y/N  Are you pregnant?    Y/N If yes:  How many weeks? __________ Do you have any implanted devices?  Y/N If yes:  Pacemaker/Spinal Cord Stimulator/Deep Brain Stimulator/Insulin Pump/Other: ________________ Do you have any implants?  Y/N If yes: Breast/Facial/Pecs/Buttocks/Calves/Hip  Replacement/ Knee Replacement/Other: _________ Do you take any blood thinners?   Y/N If yes: Coumadin (Warfarin)/Other: ___________________ Do you have a bleeding disorder?   Y/N If yes: What kind: _________________________________ Do you take any immunosuppressants?  Y/N If yes:   What kind: _________________________________ Do you take anti-inflammatories?   Y/N If yes: What kind: Advil/Aspirin/Other: ________________ Have you ever been diagnosed with Scoliosis? Y/N Have you had back surgery?   Y/N If yes:  Laminectomy/Fusion/Other: ___________________   I have read, or had read to me, the above.  I have had the opportunity to ask any questions.  All of my questions have been answered to my satisfaction and I understand the risks involved with dry needling.  [facility] consent [facility] and [facility], including dry needling, of any and all of my involved and affected muscles.  
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
## 2018-10-23 ENCOUNTER — Other Ambulatory Visit: Payer: Self-pay

## 2018-10-24 ENCOUNTER — Ambulatory Visit (INDEPENDENT_AMBULATORY_CARE_PROVIDER_SITE_OTHER): Payer: BC Managed Care – PPO | Admitting: Family Medicine

## 2018-10-24 ENCOUNTER — Encounter: Payer: Self-pay | Admitting: Family Medicine

## 2018-10-24 VITALS — BP 104/65 | HR 55 | Temp 98.7°F | Ht 66.0 in | Wt 157.8 lb

## 2018-10-24 DIAGNOSIS — R5383 Other fatigue: Secondary | ICD-10-CM

## 2018-10-24 DIAGNOSIS — Z0000 Encounter for general adult medical examination without abnormal findings: Secondary | ICD-10-CM

## 2018-10-24 DIAGNOSIS — Z23 Encounter for immunization: Secondary | ICD-10-CM

## 2018-10-24 DIAGNOSIS — Z0001 Encounter for general adult medical examination with abnormal findings: Secondary | ICD-10-CM | POA: Diagnosis not present

## 2018-10-24 DIAGNOSIS — M47816 Spondylosis without myelopathy or radiculopathy, lumbar region: Secondary | ICD-10-CM | POA: Insufficient documentation

## 2018-10-24 DIAGNOSIS — M47812 Spondylosis without myelopathy or radiculopathy, cervical region: Secondary | ICD-10-CM | POA: Insufficient documentation

## 2018-10-24 DIAGNOSIS — R6889 Other general symptoms and signs: Secondary | ICD-10-CM | POA: Diagnosis not present

## 2018-10-24 NOTE — Progress Notes (Signed)
Subjective:  Patient ID: Ashlee Maldonado, female    DOB: 1955/08/11, 63 y.o.   MRN: [id number]  Chief Complaint:  Annual Exam   HPI: Ashlee Maldonado is a 63 y.o. female presenting on 10/24/2018 for Annual Exam  Pt presents today for annual physical exam. Pt has completed mammogram and PAP at [facility]. Pt had colonoscopy at [facility]. Will request records. Pt states she continues to have lower back pain and right hip pain. She has been referred to ortho and has been attending PT. Pt states the PT seems to be beneficial. Pt states she has another appointment tomorrow to discuss further options for the pain. Pt states she is working 2nd shift and feels drained all of the time. Pt states she has the arthralgias and fatigue. States this has been ongoing for several months. No new injuries. States her sleep schedule is off due to shift work. No other complaints or concerns.   Relevant past medical, surgical, family, and social history reviewed and updated as indicated.  Allergies and medications reviewed and updated.   History reviewed. No pertinent past medical history.  Past Surgical History:  Procedure Laterality Date  . NASAL SINUS SURGERY    . TUBAL LIGATION      Social History   Socioeconomic History  . Marital status: Divorced    Spouse name: Not on file  . Number of children: Not on file  . Years of education: Not on file  . Highest education level: Not on file  Occupational History  . Not on file  Social Needs  . Financial resource strain: Not on file  . Food insecurity    Worry: Not on file    Inability: Not on file  . Transportation needs    Medical: Not on file    Non-medical: Not on file  Tobacco Use  . Smoking status: Former Smoker    Packs/day: 0.50    Types: Cigarettes    Start date: 04/19/1977    Quit date: 04/20/1991    Years since quitting: 27.5  . Smokeless tobacco: Never Used  Substance and Sexual Activity  . Alcohol use: Yes    Alcohol/week: 0.0 standard  drinks    Comment: rare  . Drug use: No  . Sexual activity: Not on file  Lifestyle  . Physical activity    Days per week: Not on file    Minutes per session: Not on file  . Stress: Not on file  Relationships  . Social Herbalist on phone: Not on file    Gets together: Not on file    Attends religious service: Not on file    Active member of club or organization: Not on file    Attends meetings of clubs or organizations: Not on file    Relationship status: Not on file  . Intimate partner violence    Fear of current or ex partner: Not on file    Emotionally abused: Not on file    Physically abused: Not on file    Forced sexual activity: Not on file  Other Topics Concern  . Not on file  Social History Narrative  . Not on file    Outpatient Encounter Medications as of 10/24/2018  Medication Sig  . [DISCONTINUED] naproxen sodium (ALEVE) 220 MG tablet Take 220 mg by mouth as needed.   No facility-administered encounter medications on file as of 10/24/2018.     Allergies  Allergen Reactions  . Other Nausea And  Vomiting    Says most pain medications cause nausea & vomiting.  . Codeine Nausea And Vomiting  . Cyclobenzaprine Other (See Comments)    Hives  . Meloxicam Hives    Review of Systems  Constitutional: Positive for fatigue. Negative for appetite change, chills, diaphoresis, fever and unexpected weight change.  Eyes: Negative for photophobia and visual disturbance.  Respiratory: Negative for cough, chest tightness and shortness of breath.   Cardiovascular: Negative for chest pain, palpitations and leg swelling.  Gastrointestinal: Negative for abdominal distention, abdominal pain, anal bleeding, blood in stool, constipation, diarrhea, nausea, rectal pain and vomiting.  Endocrine: Negative for cold intolerance, heat intolerance, polydipsia, polyphagia and polyuria.  Genitourinary: Negative for decreased urine volume, difficulty urinating, urgency, vaginal  bleeding, vaginal discharge and vaginal pain.  Musculoskeletal: Positive for arthralgias, back pain, gait problem and myalgias.  Neurological: Negative for dizziness, tremors, seizures, syncope, facial asymmetry, speech difficulty, weakness, light-headedness, numbness and headaches.  Hematological: Does not bruise/bleed easily.  Psychiatric/Behavioral: Negative for confusion.  All other systems reviewed and are negative.       Objective:  BP 104/65   Pulse (!) 55   Temp 98.7 F (37.1 C) (Oral)   Ht _0  (1.676 m)   Wt 157 lb 12.8 oz (71.6 kg)   BMI 25.47 kg/m    Wt Readings from Last 3 Encounters:  10/24/18 157 lb 12.8 oz (71.6 kg)  09/22/18 155 lb (70.3 kg)  05/09/15 144 lb (65.3 kg)    Physical Exam Vitals signs and nursing note reviewed.  Constitutional:      General: She is not in acute distress.    Appearance: Normal appearance. She is well-developed and well-groomed. She is not ill-appearing, toxic-appearing or diaphoretic.  HENT:     Head: Normocephalic and atraumatic.     Jaw: There is normal jaw occlusion.     Right Ear: Hearing normal.     Left Ear: Hearing normal.     Nose: Nose normal.     Mouth/Throat:     Lips: Pink.     Mouth: Mucous membranes are moist.     Pharynx: Oropharynx is clear. Uvula midline.  Eyes:     General: Lids are normal.     Extraocular Movements: Extraocular movements intact.     Conjunctiva/sclera: Conjunctivae normal.     Pupils: Pupils are equal, round, and reactive to light.  Neck:     Musculoskeletal: Normal range of motion and neck supple.     Thyroid: No thyroid mass, thyromegaly or thyroid tenderness.     Vascular: No carotid bruit or JVD.     Trachea: Trachea and phonation normal.  Cardiovascular:     Rate and Rhythm: Normal rate and regular rhythm.     Chest Wall: PMI is not displaced.     Pulses: Normal pulses.     Heart sounds: Normal heart sounds. No murmur. No friction rub. No gallop.   Pulmonary:     Effort:  Pulmonary effort is normal. No respiratory distress.     Breath sounds: Normal breath sounds. No wheezing.  Abdominal:     General: Bowel sounds are normal. There is no distension or abdominal bruit.     Palpations: Abdomen is soft. There is no hepatomegaly or splenomegaly.     Tenderness: There is no abdominal tenderness. There is no right CVA tenderness or left CVA tenderness.     Hernia: No hernia is present.  Musculoskeletal:     Right hip: She exhibits decreased  range of motion and tenderness. She exhibits normal strength, no bony tenderness, no swelling, no crepitus, no deformity and no laceration.     Right knee: Normal.     Lumbar back: Normal.     Right lower leg: No edema.     Left lower leg: No edema.  Lymphadenopathy:     Cervical: No cervical adenopathy.  Skin:    General: Skin is warm and dry.     Capillary Refill: Capillary refill takes less than 2 seconds.     Coloration: Skin is not cyanotic, jaundiced or pale.     Findings: No lesion or rash.  Neurological:     General: No focal deficit present.     Mental Status: She is alert and oriented to person, place, and time.     Cranial Nerves: Cranial nerves are intact.     Sensory: Sensation is intact.     Motor: Motor function is intact.     Coordination: Coordination is intact.     Gait: Gait is intact.     Deep Tendon Reflexes: Reflexes are normal and symmetric.  Psychiatric:        Attention and Perception: Attention and perception normal.        Mood and Affect: Mood and affect normal.        Speech: Speech normal.        Behavior: Behavior normal. Behavior is cooperative.        Thought Content: Thought content normal.        Cognition and Memory: Cognition and memory normal.        Judgment: Judgment normal.     No results found for this or any previous visit.     Pertinent labs & imaging results that were available during my care of the patient were reviewed by me and considered in my medical decision  making.  Assessment & Plan:  Jenisa was seen today for annual exam.  Diagnoses and all orders for this visit:  Annual physical exam Health maintenance discussed. Records for PAP, mammogram, and colonoscopy requested. Labs pending. Diet and exercise encouraged.  -     CMP14+EGFR -     CBC with Differential/Platelet -     Lipid panel -     Thyroid Panel With TSH -     Tdap vaccine greater than or equal to 7yo IM  Other fatigue Sleep hygiene discussed. Will check below. Report any new or worsening symptoms.  -     Vitamin B12 -     VITAMIN D 25 Hydroxy (Vit-D Deficiency, Fractures)  Arthropathy of lumbar facet joint Ongoing arthralgias. Does has referral to see ortho. Will check below.  -     VITAMIN D 25 Hydroxy (Vit-D Deficiency, Fractures)  Encounter for immunization -     Tdap vaccine greater than or equal to 7yo IM     Continue all other maintenance medications.  Follow up plan: Return in 6 months (on 04/26/2019).  Educational handout given for health maintenance   The above assessment and management plan was discussed with the patient. The patient verbalized understanding of and has agreed to the management plan. Patient is aware to call the clinic if symptoms persist or worsen. Patient is aware when to return to the clinic for a follow-up visit. Patient educated on when it is appropriate to go to the emergency department.   Monia Pouch, FNP-C [facility] [phone]

## 2018-10-24 NOTE — Patient Instructions (Addendum)
Health Maintenance, Female Adopting a healthy lifestyle and getting preventive care are important in promoting health and wellness. Ask your health care provider about:  The right schedule for you to have regular tests and exams.  Things you can do on your own to prevent diseases and keep yourself healthy. What should I know about diet, weight, and exercise? Eat a healthy diet   Eat a diet that includes plenty of vegetables, fruits, low-fat dairy products, and lean protein.  Do not eat a lot of foods that are high in solid fats, added sugars, or sodium. Maintain a healthy weight Body mass index (BMI) is used to identify weight problems. It estimates body fat based on height and weight. Your health care provider can help determine your BMI and help you achieve or maintain a healthy weight. Get regular exercise Get regular exercise. This is one of the most important things you can do for your health. Most adults should:  Exercise for at least 150 minutes each week. The exercise should increase your heart rate and make you sweat (moderate-intensity exercise).  Do strengthening exercises at least twice a week. This is in addition to the moderate-intensity exercise.  Spend less time sitting. Even light physical activity can be beneficial. Watch cholesterol and blood lipids Have your blood tested for lipids and cholesterol at 63 years of age, then have this test every 5 years. Have your cholesterol levels checked more often if:  Your lipid or cholesterol levels are high.  You are older than 63 years of age.  You are at high risk for heart disease. What should I know about cancer screening? Depending on your health history and family history, you may need to have cancer screening at various ages. This may include screening for:  Breast cancer.  Cervical cancer.  Colorectal cancer.  Skin cancer.  Lung cancer. What should I know about heart disease, diabetes, and high blood  pressure? Blood pressure and heart disease  High blood pressure causes heart disease and increases the risk of stroke. This is more likely to develop in people who have high blood pressure readings, are of African descent, or are overweight.  Have your blood pressure checked: ? Every 3-5 years if you are 18-39 years of age. ? Every year if you are 40 years old or older. Diabetes Have regular diabetes screenings. This checks your fasting blood sugar level. Have the screening done:  Once every three years after age 40 if you are at a normal weight and have a low risk for diabetes.  More often and at a younger age if you are overweight or have a high risk for diabetes. What should I know about preventing infection? Hepatitis B If you have a higher risk for hepatitis B, you should be screened for this virus. Talk with your health care provider to find out if you are at risk for hepatitis B infection. Hepatitis C Testing is recommended for:  Everyone born from 1945 through 1965.  Anyone with known risk factors for hepatitis C. Sexually transmitted infections (STIs)  Get screened for STIs, including gonorrhea and chlamydia, if: ? You are sexually active and are younger than 63 years of age. ? You are older than 63 years of age and your health care provider tells you that you are at risk for this type of infection. ? Your sexual activity has changed since you were last screened, and you are at increased risk for chlamydia or gonorrhea. Ask your health care provider if   you are at risk.  Ask your health care provider about whether you are at high risk for HIV. Your health care provider may recommend a prescription medicine to help prevent HIV infection. If you choose to take medicine to prevent HIV, you should first get tested for HIV. You should then be tested every 3 months for as long as you are taking the medicine. Pregnancy  If you are about to stop having your period (premenopausal) and  you may become pregnant, seek counseling before you get pregnant.  Take 400 to 800 micrograms (mcg) of folic acid every day if you become pregnant.  Ask for birth control (contraception) if you want to prevent pregnancy. Osteoporosis and menopause Osteoporosis is a disease in which the bones lose minerals and strength with aging. This can result in bone fractures. If you are 93 years old or older, or if you are at risk for osteoporosis and fractures, ask your health care provider if you should:  Be screened for bone loss.  Take a calcium or vitamin D supplement to lower your risk of fractures.  Be given hormone replacement therapy (HRT) to treat symptoms of menopause. Follow these instructions at home: Lifestyle  Do not use any products that contain nicotine or tobacco, such as cigarettes, e-cigarettes, and chewing tobacco. If you need help quitting, ask your health care provider.  Do not use street drugs.  Do not share needles.  Ask your health care provider for help if you need support or information about quitting drugs. Alcohol use  Do not drink alcohol if: ? Your health care provider tells you not to drink. ? You are pregnant, may be pregnant, or are planning to become pregnant.  If you drink alcohol: ? Limit how much you use to 0-1 drink a day. ? Limit intake if you are breastfeeding.  Be aware of how much alcohol is in your drink. In the [place] one drink equals one 12 oz bottle of beer (355 mL), one 5 oz glass of wine (148 mL), or one 1 oz glass of hard liquor (44 mL). General instructions  Schedule regular health, dental, and eye exams.  Stay current with your vaccines.  Tell your health care provider if: ? You often feel depressed. ? You have ever been abused or do not feel safe at home. Summary  Adopting a healthy lifestyle and getting preventive care are important in promoting health and wellness.  Follow your health care provider's instructions about healthy  diet, exercising, and getting tested or screened for diseases.  Follow your health care provider's instructions on monitoring your cholesterol and blood pressure. This information is not intended to replace advice given to you by your health care provider. Make sure you discuss any questions you have with your health care provider. Document Released: 10/19/2010 Document Revised: 03/29/2018 Document Reviewed: 03/29/2018 Elsevier Patient Education  2020 [place] Maintenance, Female Adopting a healthy lifestyle and getting preventive care are important in promoting health and wellness. Ask your health care provider about:  The right schedule for you to have regular tests and exams.  Things you can do on your own to prevent diseases and keep yourself healthy. What should I know about diet, weight, and exercise? Eat a healthy diet   Eat a diet that includes plenty of vegetables, fruits, low-fat dairy products, and lean protein.  Do not eat a lot of foods that are high in solid fats, added sugars, or sodium. Maintain a healthy weight Body  mass index (BMI) is used to identify weight problems. It estimates body fat based on height and weight. Your health care provider can help determine your BMI and help you achieve or maintain a healthy weight. Get regular exercise Get regular exercise. This is one of the most important things you can do for your health. Most adults should:  Exercise for at least 150 minutes each week. The exercise should increase your heart rate and make you sweat (moderate-intensity exercise).  Do strengthening exercises at least twice a week. This is in addition to the moderate-intensity exercise.  Spend less time sitting. Even light physical activity can be beneficial. Watch cholesterol and blood lipids Have your blood tested for lipids and cholesterol at 63 years of age, then have this test every 5 years. Have your cholesterol levels checked more often  if:  Your lipid or cholesterol levels are high.  You are older than 63 years of age.  You are at high risk for heart disease. What should I know about cancer screening? Depending on your health history and family history, you may need to have cancer screening at various ages. This may include screening for:  Breast cancer.  Cervical cancer.  Colorectal cancer.  Skin cancer.  Lung cancer. What should I know about heart disease, diabetes, and high blood pressure? Blood pressure and heart disease  High blood pressure causes heart disease and increases the risk of stroke. This is more likely to develop in people who have high blood pressure readings, are of African descent, or are overweight.  Have your blood pressure checked: ? Every 3-5 years if you are 58-1 years of age. ? Every year if you are 75 years old or older. Diabetes Have regular diabetes screenings. This checks your fasting blood sugar level. Have the screening done:  Once every three years after age 69 if you are at a normal weight and have a low risk for diabetes.  More often and at a younger age if you are overweight or have a high risk for diabetes. What should I know about preventing infection? Hepatitis B If you have a higher risk for hepatitis B, you should be screened for this virus. Talk with your health care provider to find out if you are at risk for hepatitis B infection. Hepatitis C Testing is recommended for:  Everyone born from [birth year] through 1965.  Anyone with known risk factors for hepatitis C. Sexually transmitted infections (STIs)  Get screened for STIs, including gonorrhea and chlamydia, if: ? You are sexually active and are younger than 63 years of age. ? You are older than 63 years of age and your health care provider tells you that you are at risk for this type of infection. ? Your sexual activity has changed since you were last screened, and you are at increased risk for chlamydia or  gonorrhea. Ask your health care provider if you are at risk.  Ask your health care provider about whether you are at high risk for HIV. Your health care provider may recommend a prescription medicine to help prevent HIV infection. If you choose to take medicine to prevent HIV, you should first get tested for HIV. You should then be tested every 3 months for as long as you are taking the medicine. Pregnancy  If you are about to stop having your period (premenopausal) and you may become pregnant, seek counseling before you get pregnant.  Take 400 to 800 micrograms (mcg) of folic acid every day if you  become pregnant.  Ask for birth control (contraception) if you want to prevent pregnancy. Osteoporosis and menopause Osteoporosis is a disease in which the bones lose minerals and strength with aging. This can result in bone fractures. If you are 39 years old or older, or if you are at risk for osteoporosis and fractures, ask your health care provider if you should:  Be screened for bone loss.  Take a calcium or vitamin D supplement to lower your risk of fractures.  Be given hormone replacement therapy (HRT) to treat symptoms of menopause. Follow these instructions at home: Lifestyle  Do not use any products that contain nicotine or tobacco, such as cigarettes, e-cigarettes, and chewing tobacco. If you need help quitting, ask your health care provider.  Do not use street drugs.  Do not share needles.  Ask your health care provider for help if you need support or information about quitting drugs. Alcohol use  Do not drink alcohol if: ? Your health care provider tells you not to drink. ? You are pregnant, may be pregnant, or are planning to become pregnant.  If you drink alcohol: ? Limit how much you use to 0-1 drink a day. ? Limit intake if you are breastfeeding.  Be aware of how much alcohol is in your drink. In the [place] one drink equals one 12 oz bottle of beer (355 mL), one 5 oz  glass of wine (148 mL), or one 1 oz glass of hard liquor (44 mL). General instructions  Schedule regular health, dental, and eye exams.  Stay current with your vaccines.  Tell your health care provider if: ? You often feel depressed. ? You have ever been abused or do not feel safe at home. Summary  Adopting a healthy lifestyle and getting preventive care are important in promoting health and wellness.  Follow your health care provider's instructions about healthy diet, exercising, and getting tested or screened for diseases.  Follow your health care provider's instructions on monitoring your cholesterol and blood pressure. This information is not intended to replace advice given to you by your health care provider. Make sure you discuss any questions you have with your health care provider. Document Released: 10/19/2010 Document Revised: 03/29/2018 Document Reviewed: 03/29/2018 Elsevier Patient Education  2020 [employer].

## 2018-10-25 ENCOUNTER — Other Ambulatory Visit: Payer: Self-pay

## 2018-10-25 ENCOUNTER — Ambulatory Visit: Payer: BC Managed Care – PPO | Admitting: Physical Therapy

## 2018-10-25 ENCOUNTER — Encounter: Payer: Self-pay | Admitting: Physical Therapy

## 2018-10-25 DIAGNOSIS — M25551 Pain in right hip: Secondary | ICD-10-CM | POA: Diagnosis not present

## 2018-10-25 DIAGNOSIS — R262 Difficulty in walking, not elsewhere classified: Secondary | ICD-10-CM

## 2018-10-25 DIAGNOSIS — M6281 Muscle weakness (generalized): Secondary | ICD-10-CM

## 2018-10-25 LAB — CBC WITH DIFFERENTIAL/PLATELET
Basophils Absolute: 0 10*3/uL (ref 0.0–0.2)
Basos: 1 %
EOS (ABSOLUTE): 0.2 10*3/uL (ref 0.0–0.4)
Eos: 4 %
Hematocrit: 37.9 % (ref 34.0–46.6)
Hemoglobin: 12.3 g/dL (ref 11.1–15.9)
Immature Grans (Abs): 0 10*3/uL (ref 0.0–0.1)
Immature Granulocytes: 0 %
Lymphocytes Absolute: 2.3 10*3/uL (ref 0.7–3.1)
Lymphs: 40 %
MCH: 29.4 pg (ref 26.6–33.0)
MCHC: 32.5 g/dL (ref 31.5–35.7)
MCV: 91 fL (ref 79–97)
Monocytes Absolute: 0.6 10*3/uL (ref 0.1–0.9)
Monocytes: 10 %
Neutrophils Absolute: 2.6 10*3/uL (ref 1.4–7.0)
Neutrophils: 45 %
Platelets: 178 10*3/uL (ref 150–450)
RBC: 4.18 x10E6/uL (ref 3.77–5.28)
RDW: 12.3 % (ref 11.7–15.4)
WBC: 5.8 10*3/uL (ref 3.4–10.8)

## 2018-10-25 LAB — CMP14+EGFR
ALT: 12 IU/L (ref 0–32)
AST: 13 IU/L (ref 0–40)
Albumin/Globulin Ratio: 1.8 (ref 1.2–2.2)
Albumin: 3.8 g/dL (ref 3.8–4.8)
Alkaline Phosphatase: 42 IU/L (ref 39–117)
BUN/Creatinine Ratio: 20 (ref 12–28)
BUN: 15 mg/dL (ref 8–27)
Bilirubin Total: 0.2 mg/dL (ref 0.0–1.2)
CO2: 23 mmol/L (ref 20–29)
Calcium: 8.9 mg/dL (ref 8.7–10.3)
Chloride: 106 mmol/L (ref 96–106)
Creatinine, Ser: 0.76 mg/dL (ref 0.57–1.00)
GFR calc Af Amer: 97 mL/min/{1.73_m2} (ref >59)
GFR calc non Af Amer: 84 mL/min/{1.73_m2} (ref >59)
Globulin, Total: 2.1 g/dL (ref 1.5–4.5)
Glucose: 85 mg/dL (ref 65–99)
Potassium: 4.3 mmol/L (ref 3.5–5.2)
Sodium: 142 mmol/L (ref 134–144)
Total Protein: 5.9 g/dL — ABNORMAL LOW (ref 6.0–8.5)

## 2018-10-25 LAB — LIPID PANEL
Chol/HDL Ratio: 2.5 ratio (ref 0.0–4.4)
Cholesterol, Total: 158 mg/dL (ref 100–199)
HDL: 62 mg/dL (ref >39)
LDL Calculated: 90 mg/dL (ref 0–99)
Triglycerides: 30 mg/dL (ref 0–149)
VLDL Cholesterol Cal: 6 mg/dL (ref 5–40)

## 2018-10-25 LAB — THYROID PANEL WITH TSH
Free Thyroxine Index: 1.6 (ref 1.2–4.9)
T3 Uptake Ratio: 25 % (ref 24–39)
T4, Total: 6.3 ug/dL (ref 4.5–12.0)
TSH: 1.68 u[IU]/mL (ref 0.450–4.500)

## 2018-10-25 LAB — VITAMIN D 25 HYDROXY (VIT D DEFICIENCY, FRACTURES): Vit D, 25-Hydroxy: 42.2 ng/mL (ref 30.0–100.0)

## 2018-10-25 LAB — VITAMIN B12: Vitamin B-12: 241 pg/mL (ref 232–1245)
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
## 2018-10-25 NOTE — Patient Instructions (Signed)
[facility]  DRY NEEDLING CONSENT FORM   Trigger point dry needling is a physical therapy approach to treat Myofascial Pain and Dysfunction.  Dry Needling (DN) is a valuable and effective way to deactivate myofascial trigger points (muscle knots). It is skilled intervention that uses a thin filiform needle to penetrate the skin and stimulate underlying myofascial trigger points, muscular, and connective tissues for the management of neuromusculoskeletal pain and movement impairments.  A local twitch response (LTR) will be elicited.  This can sometimes feel like a deep ache in the muscle during the procedure. Multiple trigger points in multiple muscles can be treated during each treatment.  No medication of any kind is injected.   As with any medical treatment and procedure, there are possible adverse events.  While significant adverse events are uncommon, they do sometimes occur and must be considered prior to giving consent.  1. Dry needling often causes a "post needling soreness".  There can be an increase in pain from a couple of hours to 2-3 days, followed by an improvement in the overall pain state. 2. Any time a needle is used there is a risk of infection.  However, we are using new, sterile, and disposable needles; infections are extremely rare. 3. There is a possibility that you may bleed or bruise.  You may feel tired and some nausea following treatment. 4. There is a rare possibility of a pneumothorax (air in the chest cavity). 5. Allergic reaction to nickel in the stainless steel needle. 6. If a nerve is touched, it may cause paresthesia (a prickling/shock sensation) which is usually brief, but may continue for a couple of days.  Following treatment stay hydrated.  Continue regular activities but not too vigorous initially after treatment for 24-48 hours. You may apply heat to sore muscles.  Dry Needling is best when combined with other physical therapy  interventions such as strengthening, stretching and other therapeutic modalities.     PLEASE ANSWER THE FOLLOWING QUESTIONS:  Do you have a lack of sensation?   Y/N  Do you have a phobia or fear of needles  Y/N  Are you pregnant?    Y/N If yes:  How many weeks? _____  Do you have any implanted devices?  Y/N If yes:  Pacemaker/Spinal Cord         Stimulator/Deep Brain         Stimulator/Insulin          Pump/Other: ____________ Do you have any implants?   Y/N If yes:      Do you take any blood thinners?   Y/N If yes: Coumadin          (Warfarin)/Other:  Do you have a bleeding disorder?   Y/N If yes: What kind:   Do you take any immunosuppressants?  Y/N If yes:   What kind:   Do you take anti-inflammatories?   Y/N If yes: What kind:  Have you ever been diagnosed with Scoliosis? Y/N  Have you had back surgery?    Y/N If yes:         Laminectomy/Fusion/Other:   I have read, or had read to me, the above.  I have had the opportunity to ask any questions.  All of my questions have been answered to my satisfaction and I understand the risks involved with dry needling.  I consent to examination and treatment at [facility], including dry needling, of any and all of my involved and affected   muscles.   

## 2018-10-31 ENCOUNTER — Ambulatory Visit: Payer: BC Managed Care – PPO | Admitting: Physical Therapy

## 2018-10-31 ENCOUNTER — Encounter: Payer: Self-pay | Admitting: Physical Therapy

## 2018-10-31 ENCOUNTER — Other Ambulatory Visit: Payer: Self-pay

## 2018-10-31 DIAGNOSIS — M25551 Pain in right hip: Secondary | ICD-10-CM

## 2018-10-31 DIAGNOSIS — R262 Difficulty in walking, not elsewhere classified: Secondary | ICD-10-CM

## 2018-10-31 DIAGNOSIS — M6281 Muscle weakness (generalized): Secondary | ICD-10-CM
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
## 2018-11-02 ENCOUNTER — Ambulatory Visit: Payer: BC Managed Care – PPO | Admitting: Physical Therapy

## 2018-11-07 ENCOUNTER — Other Ambulatory Visit: Payer: Self-pay

## 2018-11-07 ENCOUNTER — Ambulatory Visit: Payer: BC Managed Care – PPO | Admitting: Physical Therapy

## 2018-11-07 DIAGNOSIS — R262 Difficulty in walking, not elsewhere classified: Secondary | ICD-10-CM | POA: Diagnosis not present

## 2018-11-07 DIAGNOSIS — M25551 Pain in right hip: Secondary | ICD-10-CM | POA: Diagnosis not present

## 2018-11-07 DIAGNOSIS — M6281 Muscle weakness (generalized): Secondary | ICD-10-CM
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
## 2018-11-09 ENCOUNTER — Ambulatory Visit: Payer: BC Managed Care – PPO | Admitting: Physical Therapy

## 2018-11-09 ENCOUNTER — Other Ambulatory Visit: Payer: Self-pay

## 2018-11-09 DIAGNOSIS — M6281 Muscle weakness (generalized): Secondary | ICD-10-CM | POA: Diagnosis not present

## 2018-11-09 DIAGNOSIS — M25551 Pain in right hip: Secondary | ICD-10-CM | POA: Diagnosis not present

## 2018-11-09 DIAGNOSIS — R262 Difficulty in walking, not elsewhere classified: Secondary | ICD-10-CM
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
## 2018-11-14 ENCOUNTER — Ambulatory Visit: Payer: BC Managed Care – PPO | Admitting: Physical Therapy

## 2018-11-14 ENCOUNTER — Other Ambulatory Visit: Payer: Self-pay

## 2018-11-14 DIAGNOSIS — M25551 Pain in right hip: Secondary | ICD-10-CM

## 2018-11-14 DIAGNOSIS — M6281 Muscle weakness (generalized): Secondary | ICD-10-CM

## 2018-11-14 DIAGNOSIS — R262 Difficulty in walking, not elsewhere classified: Secondary | ICD-10-CM | POA: Diagnosis not present
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
## 2018-11-16 ENCOUNTER — Ambulatory Visit: Payer: BC Managed Care – PPO | Admitting: Physical Therapy

## 2018-11-16 ENCOUNTER — Other Ambulatory Visit: Payer: Self-pay

## 2018-11-16 DIAGNOSIS — M6281 Muscle weakness (generalized): Secondary | ICD-10-CM | POA: Diagnosis not present

## 2018-11-16 DIAGNOSIS — M25551 Pain in right hip: Secondary | ICD-10-CM

## 2018-11-16 DIAGNOSIS — R262 Difficulty in walking, not elsewhere classified: Secondary | ICD-10-CM
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
## 2019-04-26 ENCOUNTER — Ambulatory Visit: Payer: BC Managed Care – PPO | Admitting: Family Medicine

## 2019-04-30 ENCOUNTER — Encounter: Payer: Self-pay | Admitting: Family Medicine

## 2019-04-30 ENCOUNTER — Other Ambulatory Visit: Payer: Self-pay

## 2019-04-30 ENCOUNTER — Ambulatory Visit (INDEPENDENT_AMBULATORY_CARE_PROVIDER_SITE_OTHER): Payer: BC Managed Care – PPO | Admitting: Family Medicine

## 2019-04-30 VITALS — BP 99/57 | HR 50 | Temp 98.2°F | Resp 20 | Ht 66.0 in | Wt 156.0 lb

## 2019-04-30 DIAGNOSIS — Z1231 Encounter for screening mammogram for malignant neoplasm of breast: Secondary | ICD-10-CM | POA: Diagnosis not present

## 2019-04-30 DIAGNOSIS — Z0000 Encounter for general adult medical examination without abnormal findings: Secondary | ICD-10-CM

## 2019-04-30 DIAGNOSIS — R05 Cough: Secondary | ICD-10-CM

## 2019-04-30 DIAGNOSIS — Z1211 Encounter for screening for malignant neoplasm of colon: Secondary | ICD-10-CM

## 2019-04-30 DIAGNOSIS — Z136 Encounter for screening for cardiovascular disorders: Secondary | ICD-10-CM | POA: Diagnosis not present

## 2019-04-30 DIAGNOSIS — Z1389 Encounter for screening for other disorder: Secondary | ICD-10-CM

## 2019-04-30 DIAGNOSIS — Z124 Encounter for screening for malignant neoplasm of cervix: Secondary | ICD-10-CM | POA: Diagnosis not present

## 2019-04-30 DIAGNOSIS — R059 Cough, unspecified: Secondary | ICD-10-CM

## 2019-04-30 DIAGNOSIS — Z1212 Encounter for screening for malignant neoplasm of rectum: Secondary | ICD-10-CM

## 2019-04-30 MED ORDER — BENZONATATE 100 MG PO CAPS: 100.0000 mg | ORAL_CAPSULE | Freq: Three times a day (TID) | ORAL | 11 refills | Status: DC | PRN

## 2019-04-30 NOTE — Progress Notes (Signed)
Subjective:  Patient ID: Ashlee Maldonado, female    DOB: 01-18-1956, 64 y.o.   MRN: [id number]  Patient Care Team: Baruch Gouty, FNP as PCP - General (Family Medicine)   Chief Complaint:  Gynecologic Exam (form for work )   HPI: Ashlee Maldonado is a 64 y.o. female presenting on 04/30/2019 for Gynecologic Exam (form for work )   Pt presents today for her annual physical exam with PAP. Pt states she has been doing well overall. States she is working 10-12 hour shifts and this is causing feet and hand pain. States she has changed her shoes with some relief of symptoms. Pt states she is due for for her PAP. Denies abnormal PAPs in the past. States she is due for her colonoscopy and would like to return to [facility] for this. States she did have polyps removed with her last colonoscopy. She does have an intermittent cough. States she usually takes tessalon and this does well.     Relevant past medical, surgical, family, and social history reviewed and updated as indicated.  Allergies and medications reviewed and updated. Date reviewed: Chart in Epic.   History reviewed. No pertinent past medical history.  Past Surgical History:  Procedure Laterality Date  . NASAL SINUS SURGERY    . TUBAL LIGATION      Social History   Socioeconomic History  . Marital status: Divorced    Spouse name: Not on file  . Number of children: Not on file  . Years of education: Not on file  . Highest education level: Not on file  Occupational History  . Not on file  Tobacco Use  . Smoking status: Former Smoker    Packs/day: 0.50    Types: Cigarettes    Start date: 04/19/1977    Quit date: 04/20/1991    Years since quitting: 28.0  . Smokeless tobacco: Never Used  Substance and Sexual Activity  . Alcohol use: Yes    Alcohol/week: 0.0 standard drinks    Comment: rare  . Drug use: No  . Sexual activity: Not on file  Other Topics Concern  . Not on file  Social History Narrative  . Not on file    Social Determinants of Health   Financial Resource Strain:   . Difficulty of Paying Living Expenses: Not on file  Food Insecurity:   . Worried About Charity fundraiser in the Last Year: Not on file  . Ran Out of Food in the Last Year: Not on file  Transportation Needs:   . Lack of Transportation (Medical): Not on file  . Lack of Transportation (Non-Medical): Not on file  Physical Activity:   . Days of Exercise per Week: Not on file  . Minutes of Exercise per Session: Not on file  Stress:   . Feeling of Stress : Not on file  Social Connections:   . Frequency of Communication with Friends and Family: Not on file  . Frequency of Social Gatherings with Friends and Family: Not on file  . Attends Religious Services: Not on file  . Active Member of Clubs or Organizations: Not on file  . Attends Archivist Meetings: Not on file  . Marital Status: Not on file  Intimate Partner Violence:   . Fear of Current or Ex-Partner: Not on file  . Emotionally Abused: Not on file  . Physically Abused: Not on file  . Sexually Abused: Not on file    Outpatient Encounter Medications as of 04/30/2019  Medication Sig  . benzonatate (TESSALON PERLES) 100 MG capsule Take 1 capsule (100 mg total) by mouth 3 (three) times daily as needed for cough.   No facility-administered encounter medications on file as of 04/30/2019.    Allergies  Allergen Reactions  . Other Nausea And Vomiting    Says most pain medications cause nausea & vomiting.  . Codeine Nausea And Vomiting  . Cyclobenzaprine Other (See Comments)    Hives  . Meloxicam Hives    Review of Systems  Constitutional: Negative for activity change, appetite change, chills, diaphoresis, fatigue, fever and unexpected weight change.  HENT: Negative.  Negative for congestion, postnasal drip and rhinorrhea.   Eyes: Negative.  Negative for visual disturbance.  Respiratory: Positive for cough. Negative for chest tightness and shortness of  breath.   Cardiovascular: Negative for chest pain, palpitations and leg swelling.  Gastrointestinal: Negative for abdominal distention, abdominal pain, blood in stool, constipation, diarrhea, nausea and vomiting.  Endocrine: Negative.  Negative for polydipsia, polyphagia and polyuria.  Genitourinary: Negative for decreased urine volume, difficulty urinating, dysuria, frequency, urgency, vaginal bleeding, vaginal discharge and vaginal pain.  Musculoskeletal: Positive for arthralgias and myalgias. Negative for back pain, gait problem, joint swelling, neck pain and neck stiffness.  Skin: Negative.  Negative for color change and pallor.  Allergic/Immunologic: Negative.   Neurological: Negative for dizziness and headaches.  Hematological: Negative.  Does not bruise/bleed easily.  Psychiatric/Behavioral: Negative for agitation, confusion, hallucinations, sleep disturbance and suicidal ideas.  All other systems reviewed and are negative.       Objective:  BP (!) 99/57   Pulse (!) 50   Temp 98.2 F (36.8 C)   Resp 20   Ht '5\' 6"'  (1.676 m)   Wt 156 lb (70.8 kg)   SpO2 95%   BMI 25.18 kg/m    Wt Readings from Last 3 Encounters:  04/30/19 156 lb (70.8 kg)  10/24/18 157 lb 12.8 oz (71.6 kg)  09/22/18 155 lb (70.3 kg)    Physical Exam Vitals and nursing note reviewed.  Constitutional:      General: She is not in acute distress.    Appearance: Normal appearance. She is well-developed and well-groomed. She is not ill-appearing, toxic-appearing or diaphoretic.  HENT:     Head: Normocephalic and atraumatic.     Jaw: There is normal jaw occlusion.     Right Ear: Hearing, tympanic membrane, ear canal and external ear normal.     Left Ear: Hearing, tympanic membrane, ear canal and external ear normal.     Nose: Nose normal.     Mouth/Throat:     Lips: Pink.     Mouth: Mucous membranes are moist.     Pharynx: Oropharynx is clear. Uvula midline.  Eyes:     General: Lids are normal.      Extraocular Movements: Extraocular movements intact.     Conjunctiva/sclera: Conjunctivae normal.     Pupils: Pupils are equal, round, and reactive to light.  Neck:     Thyroid: No thyroid mass, thyromegaly or thyroid tenderness.     Vascular: No carotid bruit or JVD.     Trachea: Trachea and phonation normal.  Cardiovascular:     Rate and Rhythm: Normal rate and regular rhythm.     Chest Wall: PMI is not displaced.     Pulses: Normal pulses.     Heart sounds: Normal heart sounds. No murmur. No friction rub. No gallop.   Pulmonary:     Effort: Pulmonary effort  is normal. No respiratory distress.     Breath sounds: Normal breath sounds. No wheezing.  Chest:     Chest wall: No mass, lacerations, deformity, swelling, tenderness, crepitus or edema. There is no dullness to percussion.     Breasts: Breasts are symmetrical.        Right: Normal.        Left: Normal.  Abdominal:     General: Bowel sounds are normal. There is no distension or abdominal bruit.     Palpations: Abdomen is soft. There is no hepatomegaly or splenomegaly.     Tenderness: There is no abdominal tenderness. There is no right CVA tenderness or left CVA tenderness.     Hernia: No hernia is present. There is no hernia in the left inguinal area or right inguinal area.  Genitourinary:    General: Normal vulva.     Exam position: Lithotomy position.     Pubic Area: No rash or pubic lice.      Labia:        Right: No rash, tenderness, lesion or injury.        Left: No rash, tenderness, lesion or injury.      Urethra: No prolapse, urethral pain, urethral swelling or urethral lesion.     Vagina: Normal.     Cervix: Normal.     Uterus: Normal.      Adnexa: Right adnexa normal and left adnexa normal.     Rectum: Normal.  Musculoskeletal:        General: Normal range of motion.     Cervical back: Normal range of motion and neck supple.     Right lower leg: No edema.     Left lower leg: No edema.  Lymphadenopathy:      Cervical: No cervical adenopathy.     Upper Body:     Right upper body: No supraclavicular, axillary or pectoral adenopathy.     Left upper body: No supraclavicular, axillary or pectoral adenopathy.     Lower Body: No right inguinal adenopathy. No left inguinal adenopathy.  Skin:    General: Skin is warm and dry.     Capillary Refill: Capillary refill takes less than 2 seconds.     Coloration: Skin is not cyanotic, jaundiced or pale.     Findings: No rash.       Neurological:     General: No focal deficit present.     Mental Status: She is alert and oriented to person, place, and time.     Cranial Nerves: Cranial nerves are intact. No cranial nerve deficit.     Sensory: Sensation is intact. No sensory deficit.     Motor: Motor function is intact. No weakness.     Coordination: Coordination is intact. Coordination normal.     Gait: Gait is intact. Gait normal.     Deep Tendon Reflexes: Reflexes are normal and symmetric. Reflexes normal.  Psychiatric:        Attention and Perception: Attention and perception normal.        Mood and Affect: Mood and affect normal.        Speech: Speech normal.        Behavior: Behavior normal. Behavior is cooperative.        Thought Content: Thought content normal.        Cognition and Memory: Cognition and memory normal.        Judgment: Judgment normal.     Results for orders placed or performed in visit  on 10/24/18  CMP14+EGFR  Result Value Ref Range   Glucose 85 65 - 99 mg/dL   BUN 15 8 - 27 mg/dL   Creatinine, Ser 0.76 0.57 - 1.00 mg/dL   GFR calc non Af Amer 84 >59 mL/min/1.73   GFR calc Af Amer 97 >59 mL/min/1.73   BUN/Creatinine Ratio 20 12 - 28   Sodium 142 134 - 144 mmol/L   Potassium 4.3 3.5 - 5.2 mmol/L   Chloride 106 96 - 106 mmol/L   CO2 23 20 - 29 mmol/L   Calcium 8.9 8.7 - 10.3 mg/dL   Total Protein 5.9 (L) 6.0 - 8.5 g/dL   Albumin 3.8 3.8 - 4.8 g/dL   Globulin, Total 2.1 1.5 - 4.5 g/dL   Albumin/Globulin Ratio 1.8 1.2 -  2.2   Bilirubin Total 0.2 0.0 - 1.2 mg/dL   Alkaline Phosphatase 42 39 - 117 IU/L   AST 13 0 - 40 IU/L   ALT 12 0 - 32 IU/L  CBC with Differential/Platelet  Result Value Ref Range   WBC 5.8 3.4 - 10.8 x10E3/uL   RBC 4.18 3.77 - 5.28 x10E6/uL   Hemoglobin 12.3 11.1 - 15.9 g/dL   Hematocrit 37.9 34.0 - 46.6 %   MCV 91 79 - 97 fL   MCH 29.4 26.6 - 33.0 pg   MCHC 32.5 31.5 - 35.7 g/dL   RDW 12.3 11.7 - 15.4 %   Platelets 178 150 - 450 x10E3/uL   Neutrophils 45 Not Estab. %   Lymphs 40 Not Estab. %   Monocytes 10 Not Estab. %   Eos 4 Not Estab. %   Basos 1 Not Estab. %   Neutrophils Absolute 2.6 1.4 - 7.0 x10E3/uL   Lymphocytes Absolute 2.3 0.7 - 3.1 x10E3/uL   Monocytes Absolute 0.6 0.1 - 0.9 x10E3/uL   EOS (ABSOLUTE) 0.2 0.0 - 0.4 x10E3/uL   Basophils Absolute 0.0 0.0 - 0.2 x10E3/uL   Immature Granulocytes 0 Not Estab. %   Immature Grans (Abs) 0.0 0.0 - 0.1 x10E3/uL  Lipid panel  Result Value Ref Range   Cholesterol, Total 158 100 - 199 mg/dL   Triglycerides 30 0 - 149 mg/dL   HDL 62 >39 mg/dL   VLDL Cholesterol Cal 6 5 - 40 mg/dL   LDL Calculated 90 0 - 99 mg/dL   Chol/HDL Ratio 2.5 0.0 - 4.4 ratio  Thyroid Panel With TSH  Result Value Ref Range   TSH 1.680 0.450 - 4.500 uIU/mL   T4, Total 6.3 4.5 - 12.0 ug/dL   T3 Uptake Ratio 25 24 - 39 %   Free Thyroxine Index 1.6 1.2 - 4.9  Vitamin B12  Result Value Ref Range   Vitamin B-12 241 232 - 1,245 pg/mL  VITAMIN D 25 Hydroxy (Vit-D Deficiency, Fractures)  Result Value Ref Range   Vit D, 25-Hydroxy 42.2 30.0 - 100.0 ng/mL       Pertinent labs & imaging results that were available during my care of the patient were reviewed by me and considered in my medical decision making.  Assessment & Plan:  Ashlee Maldonado was seen today for gynecologic exam.  Diagnoses and all orders for this visit:  Annual physical exam Health maintenance discussed in detail. Labs pending. Mammogram ordered, PAP completed, referral for colonoscopy  placed.  -     Lipid panel -     TSH -     Comprehensive metabolic panel -     PAP age 39-65 -  CBC with Differential/Platelet -     MM SCREENING BREAST TOMO BILATERAL; Future  Screening for colorectal cancer -     Ambulatory referral to Gastroenterology  Encounter for screening mammogram for malignant neoplasm of breast -     MM SCREENING BREAST TOMO BILATERAL; Future  Screening for cervical cancer -     PAP age 59-65  Cough Avoid triggers. Can take below as needed for cough. -     benzonatate (TESSALON PERLES) 100 MG capsule; Take 1 capsule (100 mg total) by mouth 3 (three) times daily as needed for cough.  Screening for skin condition One concerning nevi to right breast. Will continue to evaluate and refer if any changes.     Continue all other maintenance medications.  Follow up plan: Return in 1 year (on 04/29/2020), or if symptoms worsen or fail to improve.  Continue healthy lifestyle choices, including diet (rich in fruits, vegetables, and lean proteins, and low in salt and simple carbohydrates) and exercise (at least 30 minutes of moderate physical activity daily).  Educational handout given for health maintenance.   The above assessment and management plan was discussed with the patient. The patient verbalized understanding of and has agreed to the management plan. Patient is aware to call the clinic if they develop any new symptoms or if symptoms persist or worsen. Patient is aware when to return to the clinic for a follow-up visit. Patient educated on when it is appropriate to go to the emergency department.   Monia Pouch, FNP-C [facility] [phone]

## 2019-04-30 NOTE — Patient Instructions (Signed)
Health Maintenance, Female Adopting a healthy lifestyle and getting preventive care are important in promoting health and wellness. Ask your health care provider about:  The right schedule for you to have regular tests and exams.  Things you can do on your own to prevent diseases and keep yourself healthy. What should I know about diet, weight, and exercise? Eat a healthy diet   Eat a diet that includes plenty of vegetables, fruits, low-fat dairy products, and lean protein.  Do not eat a lot of foods that are high in solid fats, added sugars, or sodium. Maintain a healthy weight Body mass index (BMI) is used to identify weight problems. It estimates body fat based on height and weight. Your health care provider can help determine your BMI and help you achieve or maintain a healthy weight. Get regular exercise Get regular exercise. This is one of the most important things you can do for your health. Most adults should:  Exercise for at least 150 minutes each week. The exercise should increase your heart rate and make you sweat (moderate-intensity exercise).  Do strengthening exercises at least twice a week. This is in addition to the moderate-intensity exercise.  Spend less time sitting. Even light physical activity can be beneficial. Watch cholesterol and blood lipids Have your blood tested for lipids and cholesterol at 64 years of age, then have this test every 5 years. Have your cholesterol levels checked more often if:  Your lipid or cholesterol levels are high.  You are older than 64 years of age.  You are at high risk for heart disease. What should I know about cancer screening? Depending on your health history and family history, you may need to have cancer screening at various ages. This may include screening for:  Breast cancer.  Cervical cancer.  Colorectal cancer.  Skin cancer.  Lung cancer. What should I know about heart disease, diabetes, and high blood  pressure? Blood pressure and heart disease  High blood pressure causes heart disease and increases the risk of stroke. This is more likely to develop in people who have high blood pressure readings, are of African descent, or are overweight.  Have your blood pressure checked: ? Every 3-5 years if you are 18-39 years of age. ? Every year if you are 40 years old or older. Diabetes Have regular diabetes screenings. This checks your fasting blood sugar level. Have the screening done:  Once every three years after age 40 if you are at a normal weight and have a low risk for diabetes.  More often and at a younger age if you are overweight or have a high risk for diabetes. What should I know about preventing infection? Hepatitis B If you have a higher risk for hepatitis B, you should be screened for this virus. Talk with your health care provider to find out if you are at risk for hepatitis B infection. Hepatitis C Testing is recommended for:  Everyone born from 1945 through 1965.  Anyone with known risk factors for hepatitis C. Sexually transmitted infections (STIs)  Get screened for STIs, including gonorrhea and chlamydia, if: ? You are sexually active and are younger than 64 years of age. ? You are older than 64 years of age and your health care provider tells you that you are at risk for this type of infection. ? Your sexual activity has changed since you were last screened, and you are at increased risk for chlamydia or gonorrhea. Ask your health care provider if   you are at risk.  Ask your health care provider about whether you are at high risk for HIV. Your health care provider may recommend a prescription medicine to help prevent HIV infection. If you choose to take medicine to prevent HIV, you should first get tested for HIV. You should then be tested every 3 months for as long as you are taking the medicine. Pregnancy  If you are about to stop having your period (premenopausal) and  you may become pregnant, seek counseling before you get pregnant.  Take 400 to 800 micrograms (mcg) of folic acid every day if you become pregnant.  Ask for birth control (contraception) if you want to prevent pregnancy. Osteoporosis and menopause Osteoporosis is a disease in which the bones lose minerals and strength with aging. This can result in bone fractures. If you are 65 years old or older, or if you are at risk for osteoporosis and fractures, ask your health care provider if you should:  Be screened for bone loss.  Take a calcium or vitamin D supplement to lower your risk of fractures.  Be given hormone replacement therapy (HRT) to treat symptoms of menopause. Follow these instructions at home: Lifestyle  Do not use any products that contain nicotine or tobacco, such as cigarettes, e-cigarettes, and chewing tobacco. If you need help quitting, ask your health care provider.  Do not use street drugs.  Do not share needles.  Ask your health care provider for help if you need support or information about quitting drugs. Alcohol use  Do not drink alcohol if: ? Your health care provider tells you not to drink. ? You are pregnant, may be pregnant, or are planning to become pregnant.  If you drink alcohol: ? Limit how much you use to 0-1 drink a day. ? Limit intake if you are breastfeeding.  Be aware of how much alcohol is in your drink. In the [place] one drink equals one 12 oz bottle of beer (355 mL), one 5 oz glass of wine (148 mL), or one 1 oz glass of hard liquor (44 mL). General instructions  Schedule regular health, dental, and eye exams.  Stay current with your vaccines.  Tell your health care provider if: ? You often feel depressed. ? You have ever been abused or do not feel safe at home. Summary  Adopting a healthy lifestyle and getting preventive care are important in promoting health and wellness.  Follow your health care provider's instructions about healthy  diet, exercising, and getting tested or screened for diseases.  Follow your health care provider's instructions on monitoring your cholesterol and blood pressure. This information is not intended to replace advice given to you by your health care provider. Make sure you discuss any questions you have with your health care provider. Document Revised: 03/29/2018 Document Reviewed: 03/29/2018 Elsevier Patient Education  2020 [employer]  

## 2019-05-01 LAB — CBC WITH DIFFERENTIAL/PLATELET
Basophils Absolute: 0.1 10*3/uL (ref 0.0–0.2)
Basos: 1 %
EOS (ABSOLUTE): 0.2 10*3/uL (ref 0.0–0.4)
Eos: 5 %
Hematocrit: 39.2 % (ref 34.0–46.6)
Hemoglobin: 12.6 g/dL (ref 11.1–15.9)
Immature Grans (Abs): 0 10*3/uL (ref 0.0–0.1)
Immature Granulocytes: 0 %
Lymphocytes Absolute: 2.4 10*3/uL (ref 0.7–3.1)
Lymphs: 49 %
MCH: 29.5 pg (ref 26.6–33.0)
MCHC: 32.1 g/dL (ref 31.5–35.7)
MCV: 92 fL (ref 79–97)
Monocytes Absolute: 0.5 10*3/uL (ref 0.1–0.9)
Monocytes: 9 %
Neutrophils Absolute: 1.8 10*3/uL (ref 1.4–7.0)
Neutrophils: 36 %
Platelets: 192 10*3/uL (ref 150–450)
RBC: 4.27 x10E6/uL (ref 3.77–5.28)
RDW: 12 % (ref 11.7–15.4)
WBC: 4.9 10*3/uL (ref 3.4–10.8)

## 2019-05-01 LAB — LIPID PANEL
Chol/HDL Ratio: 2.3 ratio (ref 0.0–4.4)
Cholesterol, Total: 171 mg/dL (ref 100–199)
HDL: 75 mg/dL (ref >39)
LDL Chol Calc (NIH): 89 mg/dL (ref 0–99)
Triglycerides: 29 mg/dL (ref 0–149)
VLDL Cholesterol Cal: 7 mg/dL (ref 5–40)

## 2019-05-01 LAB — COMPREHENSIVE METABOLIC PANEL
ALT: 11 IU/L (ref 0–32)
AST: 14 IU/L (ref 0–40)
Albumin/Globulin Ratio: 1.7 (ref 1.2–2.2)
Albumin: 3.8 g/dL (ref 3.8–4.8)
Alkaline Phosphatase: 47 IU/L (ref 39–117)
BUN/Creatinine Ratio: 20 (ref 12–28)
BUN: 17 mg/dL (ref 8–27)
Bilirubin Total: 0.4 mg/dL (ref 0.0–1.2)
CO2: 25 mmol/L (ref 20–29)
Calcium: 8.8 mg/dL (ref 8.7–10.3)
Chloride: 105 mmol/L (ref 96–106)
Creatinine, Ser: 0.83 mg/dL (ref 0.57–1.00)
GFR calc Af Amer: 87 mL/min/{1.73_m2} (ref >59)
GFR calc non Af Amer: 75 mL/min/{1.73_m2} (ref >59)
Globulin, Total: 2.3 g/dL (ref 1.5–4.5)
Glucose: 84 mg/dL (ref 65–99)
Potassium: 4.2 mmol/L (ref 3.5–5.2)
Sodium: 140 mmol/L (ref 134–144)
Total Protein: 6.1 g/dL (ref 6.0–8.5)

## 2019-05-01 LAB — TSH: TSH: 1.59 u[IU]/mL (ref 0.450–4.500)

## 2019-05-02 LAB — IGP, APTIMA HPV, RFX 16/18,45: HPV Aptima: NEGATIVE

## 2019-05-16 DIAGNOSIS — Z1231 Encounter for screening mammogram for malignant neoplasm of breast: Secondary | ICD-10-CM | POA: Diagnosis not present

## 2019-07-09 DIAGNOSIS — K644 Residual hemorrhoidal skin tags: Secondary | ICD-10-CM | POA: Diagnosis not present

## 2019-07-09 DIAGNOSIS — Z86010 Personal history of colonic polyps: Secondary | ICD-10-CM | POA: Diagnosis not present

## 2019-07-09 DIAGNOSIS — K621 Rectal polyp: Secondary | ICD-10-CM | POA: Diagnosis not present

## 2019-08-02 NOTE — Progress Notes (Signed)
Polyps on colonoscopy. Repeat in 5 years per GI.

## 2020-06-09 ENCOUNTER — Other Ambulatory Visit: Payer: Self-pay

## 2020-06-09 ENCOUNTER — Ambulatory Visit (INDEPENDENT_AMBULATORY_CARE_PROVIDER_SITE_OTHER): Payer: BC Managed Care – PPO | Admitting: Family Medicine

## 2020-06-09 ENCOUNTER — Other Ambulatory Visit (HOSPITAL_COMMUNITY)
Admission: RE | Admit: 2020-06-09 | Discharge: 2020-06-09 | Disposition: A | Discharge status: Home / Self Care | Payer: BC Managed Care – PPO | Source: Ambulatory Visit | Attending: Family Medicine | Admitting: Family Medicine

## 2020-06-09 ENCOUNTER — Encounter: Payer: Self-pay | Admitting: Family Medicine

## 2020-06-09 VITALS — BP 118/69 | HR 57 | Temp 98.1°F | Ht 66.0 in | Wt 160.0 lb

## 2020-06-09 DIAGNOSIS — Z01419 Encounter for gynecological examination (general) (routine) without abnormal findings: Secondary | ICD-10-CM | POA: Diagnosis not present

## 2020-06-09 DIAGNOSIS — Z124 Encounter for screening for malignant neoplasm of cervix: Secondary | ICD-10-CM | POA: Insufficient documentation

## 2020-06-09 DIAGNOSIS — Z6825 Body mass index (BMI) 25.0-25.9, adult: Secondary | ICD-10-CM | POA: Diagnosis not present

## 2020-06-09 NOTE — Progress Notes (Signed)
Zhaniya Swallows is a 65 y.o. female presents to office today for annual physical exam examination.    Mycala reports that she has been doing well. She has no new concerns today.  She had an abnormal pap last year and needs to have this repeated. She has a mammogram scheduled next month with the mobile bus. She is UTD on vaccines. She had a colonoscopy last year. She did have a cup of coffee with powder cream about 1.5 hours ago.   Depression screen [facility] 2/9 06/09/2020 04/30/2019 10/24/2018 09/22/2018 05/09/2015  Decreased Interest 0 0 0 0 0  Down, Depressed, Hopeless 0 0 0 0 0  PHQ - 2 Score 0 0 0 0 0  Altered sleeping - - - 0 -  Tired, decreased energy - - - 0 -  Change in appetite - - - 0 -  Feeling bad or failure about yourself  - - - 0 -  Trouble concentrating - - - 0 -  Moving slowly or fidgety/restless - - - 0 -  Suicidal thoughts - - - 0 -  PHQ-9 Score - - - 0 -    History reviewed. No pertinent past medical history. Social History   Socioeconomic History  . Marital status: Divorced    Spouse name: Not on file  . Number of children: Not on file  . Years of education: Not on file  . Highest education level: Not on file  Occupational History  . Not on file  Tobacco Use  . Smoking status: Former Smoker    Packs/day: 0.50    Types: Cigarettes    Start date: 04/19/1977    Quit date: 04/20/1991    Years since quitting: 29.1  . Smokeless tobacco: Never Used  Vaping Use  . Vaping Use: Never used  Substance and Sexual Activity  . Alcohol use: Yes    Alcohol/week: 0.0 standard drinks    Comment: rare  . Drug use: No  . Sexual activity: Not on file  Other Topics Concern  . Not on file  Social History Narrative  . Not on file   Social Determinants of Health   Financial Resource Strain: Not on file  Food Insecurity: Not on file  Transportation Needs: Not on file  Physical Activity: Not on file  Stress: Not on file  Social Connections: Not on file  Intimate Partner Violence:  Not on file   Past Surgical History:  Procedure Laterality Date  . NASAL SINUS SURGERY    . TUBAL LIGATION     Family History  Problem Relation Age of Onset  . Cancer Mother   . Heart disease Father   . Heart disease Sister   . Hyperlipidemia Sister    No current outpatient medications on file.  Allergies  Allergen Reactions  . Other Nausea And Vomiting    Says most pain medications cause nausea & vomiting.  . Codeine Nausea And Vomiting  . Cyclobenzaprine Other (See Comments)    Hives  . Meloxicam Hives     ROS: Review of Systems A comprehensive review of systems was negative.    Physical exam Physical Exam Vitals and nursing note reviewed. Exam conducted with a chaperone present.  Constitutional:      General: She is not in acute distress.    Appearance: Normal appearance. She is normal weight. She is not ill-appearing, toxic-appearing or diaphoretic.  HENT:     Head: Normocephalic and atraumatic.     Right Ear: Tympanic membrane, ear canal  and external ear normal.     Left Ear: Tympanic membrane, ear canal and external ear normal.     Nose: Nose normal.     Mouth/Throat:     Mouth: Mucous membranes are moist.     Pharynx: Oropharynx is clear.  Eyes:     Extraocular Movements: Extraocular movements intact.     Conjunctiva/sclera: Conjunctivae normal.     Pupils: Pupils are equal, round, and reactive to light.  Cardiovascular:     Rate and Rhythm: Normal rate and regular rhythm.     Heart sounds: Normal heart sounds. No murmur heard.   Pulmonary:     Effort: Pulmonary effort is normal. No respiratory distress.     Breath sounds: Normal breath sounds.  Abdominal:     General: Bowel sounds are normal. There is no distension.     Palpations: Abdomen is soft.     Tenderness: There is no abdominal tenderness. There is no guarding.  Genitourinary:    Exam position: Lithotomy position.     Pubic Area: No rash.      Labia:        Right: No rash, tenderness or  lesion.        Left: No rash, tenderness or lesion.      Vagina: Normal. No erythema, tenderness or lesions.     Cervix: Normal.     Uterus: Normal.      Adnexa: Right adnexa normal and left adnexa normal.  Musculoskeletal:        General: Normal range of motion.     Cervical back: Normal range of motion and neck supple. No tenderness.     Right lower leg: No edema.     Left lower leg: No edema.  Lymphadenopathy:     Cervical: No cervical adenopathy.  Skin:    General: Skin is warm and dry.     Capillary Refill: Capillary refill takes less than 2 seconds.  Neurological:     General: No focal deficit present.     Mental Status: She is alert and oriented to person, place, and time.     Motor: No weakness.     Gait: Gait normal.  Psychiatric:        Mood and Affect: Mood normal.        Behavior: Behavior normal.        Thought Content: Thought content normal.        Judgment: Judgment normal.     Assessment/ Plan: Hendricks Milo here for annual physical exam.   Taelyn was seen today for annual exam.  Diagnoses and all orders for this visit:  Well woman exam with routine gynecological exam Unremarkable exam today. Labs pending as below.  -     CMP14+EGFR -     CBC with Differential/Platelet -     Lipid panel -     Thyroid Panel With TSH -     VITAMIN D 25 Hydroxy (Vit-D Deficiency, Fractures)  BMI 25.0-25.9,adult Diet and exercise.  -     CMP14+EGFR -     CBC with Differential/Platelet -     Lipid panel -     Thyroid Panel With TSH  Screening for cervical cancer -     Cytology - PAP   No problem-specific Assessment & Plan notes found for this encounter.   Counseled on healthy lifestyle choices, including diet (rich in fruits, vegetables and lean meats and low in salt and simple carbohydrates) and exercise (at least 30 minutes of  moderate physical activity daily).  Patient to follow up in 1 year for annual exam or sooner if needed.  The above assessment and  management plan was discussed with the patient. The patient verbalized understanding of and has agreed to the management plan. Patient is aware to call the clinic if symptoms persist or worsen. Patient is aware when to return to the clinic for a follow-up visit. Patient educated on when it is appropriate to go to the emergency department.   Marjorie Smolder, FNP-C [facility] [address] [place], [place] [address] [phone]

## 2020-06-09 NOTE — Patient Instructions (Signed)
Health Maintenance, Female Adopting a healthy lifestyle and getting preventive care are important in promoting health and wellness. Ask your health care provider about:  The right schedule for you to have regular tests and exams.  Things you can do on your own to prevent diseases and keep yourself healthy. What should I know about diet, weight, and exercise? Eat a healthy diet  Eat a diet that includes plenty of vegetables, fruits, low-fat dairy products, and lean protein.  Do not eat a lot of foods that are high in solid fats, added sugars, or sodium.   Maintain a healthy weight Body mass index (BMI) is used to identify weight problems. It estimates body fat based on height and weight. Your health care provider can help determine your BMI and help you achieve or maintain a healthy weight. Get regular exercise Get regular exercise. This is one of the most important things you can do for your health. Most adults should:  Exercise for at least 150 minutes each week. The exercise should increase your heart rate and make you sweat (moderate-intensity exercise).  Do strengthening exercises at least twice a week. This is in addition to the moderate-intensity exercise.  Spend less time sitting. Even light physical activity can be beneficial. Watch cholesterol and blood lipids Have your blood tested for lipids and cholesterol at 64 years of age, then have this test every 5 years. Have your cholesterol levels checked more often if:  Your lipid or cholesterol levels are high.  You are older than 65 years of age.  You are at high risk for heart disease. What should I know about cancer screening? Depending on your health history and family history, you may need to have cancer screening at various ages. This may include screening for:  Breast cancer.  Cervical cancer.  Colorectal cancer.  Skin cancer.  Lung cancer. What should I know about heart disease, diabetes, and high blood  pressure? Blood pressure and heart disease  High blood pressure causes heart disease and increases the risk of stroke. This is more likely to develop in people who have high blood pressure readings, are of African descent, or are overweight.  Have your blood pressure checked: ? Every 3-5 years if you are 38-35 years of age. ? Every year if you are 19 years old or older. Diabetes Have regular diabetes screenings. This checks your fasting blood sugar level. Have the screening done:  Once every three years after age 64 if you are at a normal weight and have a low risk for diabetes.  More often and at a younger age if you are overweight or have a high risk for diabetes. What should I know about preventing infection? Hepatitis B If you have a higher risk for hepatitis B, you should be screened for this virus. Talk with your health care provider to find out if you are at risk for hepatitis B infection. Hepatitis C Testing is recommended for:  Everyone born from [birth year] through 1965.  Anyone with known risk factors for hepatitis C. Sexually transmitted infections (STIs)  Get screened for STIs, including gonorrhea and chlamydia, if: ? You are sexually active and are younger than 65 years of age. ? You are older than 65 years of age and your health care provider tells you that you are at risk for this type of infection. ? Your sexual activity has changed since you were last screened, and you are at increased risk for chlamydia or gonorrhea. Ask your health care  provider if you are at risk.  Ask your health care provider about whether you are at high risk for HIV. Your health care provider may recommend a prescription medicine to help prevent HIV infection. If you choose to take medicine to prevent HIV, you should first get tested for HIV. You should then be tested every 3 months for as long as you are taking the medicine. Pregnancy  If you are about to stop having your period (premenopausal) and  you may become pregnant, seek counseling before you get pregnant.  Take 400 to 800 micrograms (mcg) of folic acid every day if you become pregnant.  Ask for birth control (contraception) if you want to prevent pregnancy. Osteoporosis and menopause Osteoporosis is a disease in which the bones lose minerals and strength with aging. This can result in bone fractures. If you are 33 years old or older, or if you are at risk for osteoporosis and fractures, ask your health care provider if you should:  Be screened for bone loss.  Take a calcium or vitamin D supplement to lower your risk of fractures.  Be given hormone replacement therapy (HRT) to treat symptoms of menopause. Follow these instructions at home: Lifestyle  Do not use any products that contain nicotine or tobacco, such as cigarettes, e-cigarettes, and chewing tobacco. If you need help quitting, ask your health care provider.  Do not use street drugs.  Do not share needles.  Ask your health care provider for help if you need support or information about quitting drugs. Alcohol use  Do not drink alcohol if: ? Your health care provider tells you not to drink. ? You are pregnant, may be pregnant, or are planning to become pregnant.  If you drink alcohol: ? Limit how much you use to 0-1 drink a day. ? Limit intake if you are breastfeeding.  Be aware of how much alcohol is in your drink. In the [place] one drink equals one 12 oz bottle of beer (355 mL), one 5 oz glass of wine (148 mL), or one 1 oz glass of hard liquor (44 mL). General instructions  Schedule regular health, dental, and eye exams.  Stay current with your vaccines.  Tell your health care provider if: ? You often feel depressed. ? You have ever been abused or do not feel safe at home. Summary  Adopting a healthy lifestyle and getting preventive care are important in promoting health and wellness.  Follow your health care provider's instructions about healthy  diet, exercising, and getting tested or screened for diseases.  Follow your health care provider's instructions on monitoring your cholesterol and blood pressure. This information is not intended to replace advice given to you by your health care provider. Make sure you discuss any questions you have with your health care provider. Document Revised: 03/29/2018 Document Reviewed: 03/29/2018 Elsevier Patient Education  2021 [employer].     Why follow it? Research shows. . Those who follow the Mediterranean diet have a reduced risk of heart disease  . The diet is associated with a reduced incidence of Parkinson's and Alzheimer's diseases . People following the diet may have longer life expectancies and lower rates of chronic diseases  . The Dietary Guidelines for Americans recommends the Mediterranean diet as an eating plan to promote health and prevent disease  What Is the Mediterranean Diet?  . Healthy eating plan based on typical foods and recipes of Mediterranean-style cooking . The diet is primarily a plant based diet; these foods should  make up a majority of meals   Starches - Plant based foods should make up a majority of meals - They are an important sources of vitamins, minerals, energy, antioxidants, and fiber - Choose whole grains, foods high in fiber and minimally processed items  - Typical grain sources include wheat, oats, barley, corn, brown rice, bulgar, farro, millet, polenta, couscous  - Various types of beans include chickpeas, lentils, fava beans, black beans, white beans   Fruits  Veggies - Large quantities of antioxidant rich fruits & veggies; 6 or more servings  - Vegetables can be eaten raw or lightly drizzled with oil and cooked  - Vegetables common to the traditional Mediterranean Diet include: artichokes, arugula, beets, broccoli, brussel sprouts, cabbage, carrots, celery, collard greens, cucumbers, eggplant, kale, leeks, lemons, lettuce, mushrooms, okra, onions,  peas, peppers, potatoes, pumpkin, radishes, rutabaga, shallots, spinach, sweet potatoes, turnips, zucchini - Fruits common to the Mediterranean Diet include: apples, apricots, avocados, cherries, clementines, dates, figs, grapefruits, grapes, melons, nectarines, oranges, peaches, pears, pomegranates, strawberries, tangerines  Fats - Replace butter and margarine with healthy oils, such as olive oil, canola oil, and tahini  - Limit nuts to no more than a handful a day  - Nuts include walnuts, almonds, pecans, pistachios, pine nuts  - Limit or avoid candied, honey roasted or heavily salted nuts - Olives are central to the [employer] - can be eaten whole or used in a variety of dishes   Meats Protein - Limiting red meat: no more than a few times a month - When eating red meat: choose lean cuts and keep the portion to the size of deck of cards - Eggs: approx. 0 to 4 times a week  - Fish and lean poultry: at least 2 a week  - Healthy protein sources include, chicken, [place], lean beef, lamb - Increase intake of seafood such as tuna, salmon, trout, mackerel, shrimp, scallops - Avoid or limit high fat processed meats such as sausage and bacon  Dairy - Include moderate amounts of low fat dairy products  - Focus on healthy dairy such as fat free yogurt, skim milk, low or reduced fat cheese - Limit dairy products higher in fat such as whole or 2% milk, cheese, ice cream  Alcohol - Moderate amounts of red wine is ok  - No more than 5 oz daily for women (all ages) and men older than age 59  - No more than 10 oz of wine daily for men younger than 5  Other - Limit sweets and other desserts  - Use herbs and spices instead of salt to flavor foods  - Herbs and spices common to the traditional Mediterranean Diet include: basil, bay leaves, chives, cloves, cumin, fennel, garlic, lavender, marjoram, mint, oregano, parsley, pepper, rosemary, sage, savory, sumac, tarragon, thyme   It's not just a diet,  it's a lifestyle:  . The Mediterranean diet includes lifestyle factors typical of those in the region  . Foods, drinks and meals are best eaten with others and savored . Daily physical activity is important for overall good health . This could be strenuous exercise like running and aerobics . This could also be more leisurely activities such as walking, housework, yard-work, or taking the stairs . Moderation is the key; a balanced and healthy diet accommodates most foods and drinks . Consider portion sizes and frequency of consumption of certain foods   Meal Ideas & Options:  . Breakfast:  o Whole wheat toast or whole wheat [place]  muffins with peanut butter & hard boiled egg o Steel cut oats topped with apples & cinnamon and skim milk  o Fresh fruit: banana, strawberries, melon, berries, peaches  o Smoothies: strawberries, bananas, greek yogurt, peanut butter o Low fat greek yogurt with blueberries and granola  o Egg white omelet with spinach and mushrooms o Breakfast couscous: whole wheat couscous, apricots, skim milk, cranberries  . Sandwiches:  o Hummus and grilled vegetables (peppers, zucchini, squash) on whole wheat bread   o Grilled chicken on whole wheat pita with lettuce, tomatoes, cucumbers or tzatziki  o Tuna salad on whole wheat bread: tuna salad made with greek yogurt, olives, red peppers, capers, green onions o Garlic rosemary lamb pita: lamb sauted with garlic, rosemary, salt & pepper; add lettuce, cucumber, greek yogurt to pita - flavor with lemon juice and black pepper  . Seafood:  o Mediterranean grilled salmon, seasoned with garlic, basil, parsley, lemon juice and black pepper o Shrimp, lemon, and spinach whole-grain pasta salad made with low fat greek yogurt  o Seared scallops with lemon orzo  o Seared tuna steaks seasoned salt, pepper, coriander topped with tomato mixture of olives, tomatoes, olive oil, minced garlic, parsley, green onions and cappers  . Meats:   o Herbed greek chicken salad with kalamata olives, cucumber, feta  o Red bell peppers stuffed with spinach, bulgur, lean ground beef (or lentils) & topped with feta   o Kebabs: skewers of chicken, tomatoes, onions, zucchini, squash  o [place] burgers: made with red onions, mint, dill, lemon juice, feta cheese topped with roasted red peppers . Vegetarian o Cucumber salad: cucumbers, artichoke hearts, celery, red onion, feta cheese, tossed in olive oil & lemon juice  o Hummus and whole grain pita points with a greek salad (lettuce, tomato, feta, olives, cucumbers, red onion) o Lentil soup with celery, carrots made with vegetable broth, garlic, salt and pepper  o Tabouli salad: parsley, bulgur, mint, scallions, cucumbers, tomato, radishes, lemon juice, olive oil, salt and pepper.      American Heart Association (AHA) Exercise Recommendation  Being physically active is important to prevent heart disease and stroke, the nation's No. 1and No. 5killers. To improve overall cardiovascular health, we suggest at least 150 minutes per week of moderate exercise or 75 minutes per week of vigorous exercise (or a combination of moderate and vigorous activity). Thirty minutes a day, five times a week is an easy goal to remember. You will also experience benefits even if you divide your time into two or three segments of 10 to 15 minutes per day.  For people who would benefit from lowering their blood pressure or cholesterol, we recommend 40 minutes of aerobic exercise of moderate to vigorous intensity three to four times a week to lower the risk for heart attack and stroke.  Physical activity is anything that makes you move your body and burn calories.  This includes things like climbing stairs or playing sports. Aerobic exercises benefit your heart, and include walking, jogging, swimming or biking. Strength and stretching exercises are best for overall stamina and flexibility.  The simplest, positive change you  can make to effectively improve your heart health is to start walking. It's enjoyable, free, easy, social and great exercise. A walking program is flexible and boasts high success rates because people can stick with it. It's easy for walking to become a regular and satisfying part of life.   For Overall Cardiovascular Health:  At least 30 minutes of moderate-intensity aerobic activity at  least 5 days per week for a total of 150  OR   At least 25 minutes of vigorous aerobic activity at least 3 days per week for a total of 75 minutes; or a combination of moderate- and vigorous-intensity aerobic activity  AND   Moderate- to high-intensity muscle-strengthening activity at least 2 days per week for additional health benefits.  For Lowering Blood Pressure and Cholesterol  An average 40 minutes of moderate- to vigorous-intensity aerobic activity 3 or 4 times per week  What if I can't make it to the time goal? Something is always better than nothing! And everyone has to start somewhere. Even if you've been sedentary for years, today is the day you can begin to make healthy changes in your life. If you don't think you'll make it for 30 or 40 minutes, set a reachable goal for today. You can work up toward your overall goal by increasing your time as you get stronger. Don't let all-or-nothing thinking rob you of doing what you can every day.  Source:http://www.heart.org

## 2020-06-10 ENCOUNTER — Other Ambulatory Visit: Payer: Self-pay | Admitting: Family Medicine

## 2020-06-10 DIAGNOSIS — E559 Vitamin D deficiency, unspecified: Secondary | ICD-10-CM | POA: Insufficient documentation

## 2020-06-10 LAB — CBC WITH DIFFERENTIAL/PLATELET
Basophils Absolute: 0.1 10*3/uL (ref 0.0–0.2)
Basos: 1 %
EOS (ABSOLUTE): 0.3 10*3/uL (ref 0.0–0.4)
Eos: 5 %
Hematocrit: 40.2 % (ref 34.0–46.6)
Hemoglobin: 13.5 g/dL (ref 11.1–15.9)
Immature Grans (Abs): 0 10*3/uL (ref 0.0–0.1)
Immature Granulocytes: 0 %
Lymphocytes Absolute: 2 10*3/uL (ref 0.7–3.1)
Lymphs: 38 %
MCH: 29.8 pg (ref 26.6–33.0)
MCHC: 33.6 g/dL (ref 31.5–35.7)
MCV: 89 fL (ref 79–97)
Monocytes Absolute: 0.5 10*3/uL (ref 0.1–0.9)
Monocytes: 9 %
Neutrophils Absolute: 2.4 10*3/uL (ref 1.4–7.0)
Neutrophils: 47 %
Platelets: 201 10*3/uL (ref 150–450)
RBC: 4.53 x10E6/uL (ref 3.77–5.28)
RDW: 12 % (ref 11.7–15.4)
WBC: 5.2 10*3/uL (ref 3.4–10.8)

## 2020-06-10 LAB — LIPID PANEL
Chol/HDL Ratio: 2.5 ratio (ref 0.0–4.4)
Cholesterol, Total: 173 mg/dL (ref 100–199)
HDL: 70 mg/dL (ref >39)
LDL Chol Calc (NIH): 96 mg/dL (ref 0–99)
Triglycerides: 29 mg/dL (ref 0–149)
VLDL Cholesterol Cal: 7 mg/dL (ref 5–40)

## 2020-06-10 LAB — CMP14+EGFR
ALT: 13 IU/L (ref 0–32)
AST: 14 IU/L (ref 0–40)
Albumin/Globulin Ratio: 1.5 (ref 1.2–2.2)
Albumin: 4 g/dL (ref 3.8–4.8)
Alkaline Phosphatase: 49 IU/L (ref 44–121)
BUN/Creatinine Ratio: 16 (ref 12–28)
BUN: 14 mg/dL (ref 8–27)
Bilirubin Total: 0.4 mg/dL (ref 0.0–1.2)
CO2: 23 mmol/L (ref 20–29)
Calcium: 9 mg/dL (ref 8.7–10.3)
Chloride: 107 mmol/L — ABNORMAL HIGH (ref 96–106)
Creatinine, Ser: 0.86 mg/dL (ref 0.57–1.00)
GFR calc Af Amer: 83 mL/min/{1.73_m2} (ref >59)
GFR calc non Af Amer: 72 mL/min/{1.73_m2} (ref >59)
Globulin, Total: 2.6 g/dL (ref 1.5–4.5)
Glucose: 97 mg/dL (ref 65–99)
Potassium: 4.4 mmol/L (ref 3.5–5.2)
Sodium: 142 mmol/L (ref 134–144)
Total Protein: 6.6 g/dL (ref 6.0–8.5)

## 2020-06-10 LAB — THYROID PANEL WITH TSH
Free Thyroxine Index: 1.7 (ref 1.2–4.9)
T3 Uptake Ratio: 26 % (ref 24–39)
T4, Total: 6.5 ug/dL (ref 4.5–12.0)
TSH: 1.6 u[IU]/mL (ref 0.450–4.500)

## 2020-06-10 LAB — VITAMIN D 25 HYDROXY (VIT D DEFICIENCY, FRACTURES): Vit D, 25-Hydroxy: 28.7 ng/mL — ABNORMAL LOW (ref 30.0–100.0)

## 2020-06-10 MED ORDER — VITAMIN D (ERGOCALCIFEROL) 1.25 MG (50000 UNIT) PO CAPS: 50000.0000 [IU] | ORAL_CAPSULE | ORAL | 0 refills | Status: DC

## 2020-06-16 LAB — CYTOLOGY - PAP
Comment: NEGATIVE
High risk HPV: NEGATIVE

## 2020-07-15 ENCOUNTER — Other Ambulatory Visit: Payer: Self-pay | Admitting: Family Medicine

## 2020-07-15 DIAGNOSIS — Z1231 Encounter for screening mammogram for malignant neoplasm of breast: Secondary | ICD-10-CM

## 2020-07-16 ENCOUNTER — Other Ambulatory Visit: Payer: Self-pay

## 2020-07-16 ENCOUNTER — Ambulatory Visit
Admission: RE | Admit: 2020-07-16 | Discharge: 2020-07-16 | Disposition: A | Discharge status: Home / Self Care | Payer: BC Managed Care – PPO | Source: Ambulatory Visit | Attending: Family Medicine | Admitting: Family Medicine

## 2020-07-16 DIAGNOSIS — Z1231 Encounter for screening mammogram for malignant neoplasm of breast: Secondary | ICD-10-CM | POA: Diagnosis not present

## 2020-10-16 DIAGNOSIS — Z23 Encounter for immunization: Secondary | ICD-10-CM | POA: Diagnosis not present

## 2021-02-22 DIAGNOSIS — M779 Enthesopathy, unspecified: Secondary | ICD-10-CM | POA: Diagnosis not present

## 2021-02-22 DIAGNOSIS — J0100 Acute maxillary sinusitis, unspecified: Secondary | ICD-10-CM | POA: Diagnosis not present

## 2021-02-27 DIAGNOSIS — R0689 Other abnormalities of breathing: Secondary | ICD-10-CM | POA: Diagnosis not present

## 2021-02-27 DIAGNOSIS — R001 Bradycardia, unspecified: Secondary | ICD-10-CM | POA: Diagnosis not present

## 2021-02-27 DIAGNOSIS — R000 Tachycardia, unspecified: Secondary | ICD-10-CM | POA: Diagnosis not present

## 2021-02-27 DIAGNOSIS — R519 Headache, unspecified: Secondary | ICD-10-CM | POA: Diagnosis not present

## 2021-02-27 DIAGNOSIS — R42 Dizziness and giddiness: Secondary | ICD-10-CM | POA: Diagnosis not present

## 2021-02-27 DIAGNOSIS — I499 Cardiac arrhythmia, unspecified: Secondary | ICD-10-CM | POA: Diagnosis not present

## 2021-02-27 DIAGNOSIS — H538 Other visual disturbances: Secondary | ICD-10-CM | POA: Diagnosis not present

## 2021-02-27 DIAGNOSIS — G4489 Other headache syndrome: Secondary | ICD-10-CM | POA: Diagnosis not present

## 2021-02-27 DIAGNOSIS — R410 Disorientation, unspecified: Secondary | ICD-10-CM | POA: Diagnosis not present

## 2021-02-27 DIAGNOSIS — H539 Unspecified visual disturbance: Secondary | ICD-10-CM | POA: Diagnosis not present

## 2021-02-27 DIAGNOSIS — Z888 Allergy status to other drugs, medicaments and biological substances status: Secondary | ICD-10-CM | POA: Diagnosis not present

## 2021-02-27 DIAGNOSIS — Z885 Allergy status to narcotic agent status: Secondary | ICD-10-CM | POA: Diagnosis not present

## 2021-02-28 DIAGNOSIS — R519 Headache, unspecified: Secondary | ICD-10-CM | POA: Diagnosis not present

## 2021-03-01 IMAGING — DX DG HIP (WITH OR WITHOUT PELVIS) 2-3V RIGHT
3 series · 3 of 3 positions shown · non-contrast
Comparison: None.

CLINICAL DATA: Right hip pain after trauma several weeks ago.

EXAM:
DG HIP (WITH OR WITHOUT PELVIS) 2-3V RIGHT

[pelvis ap]
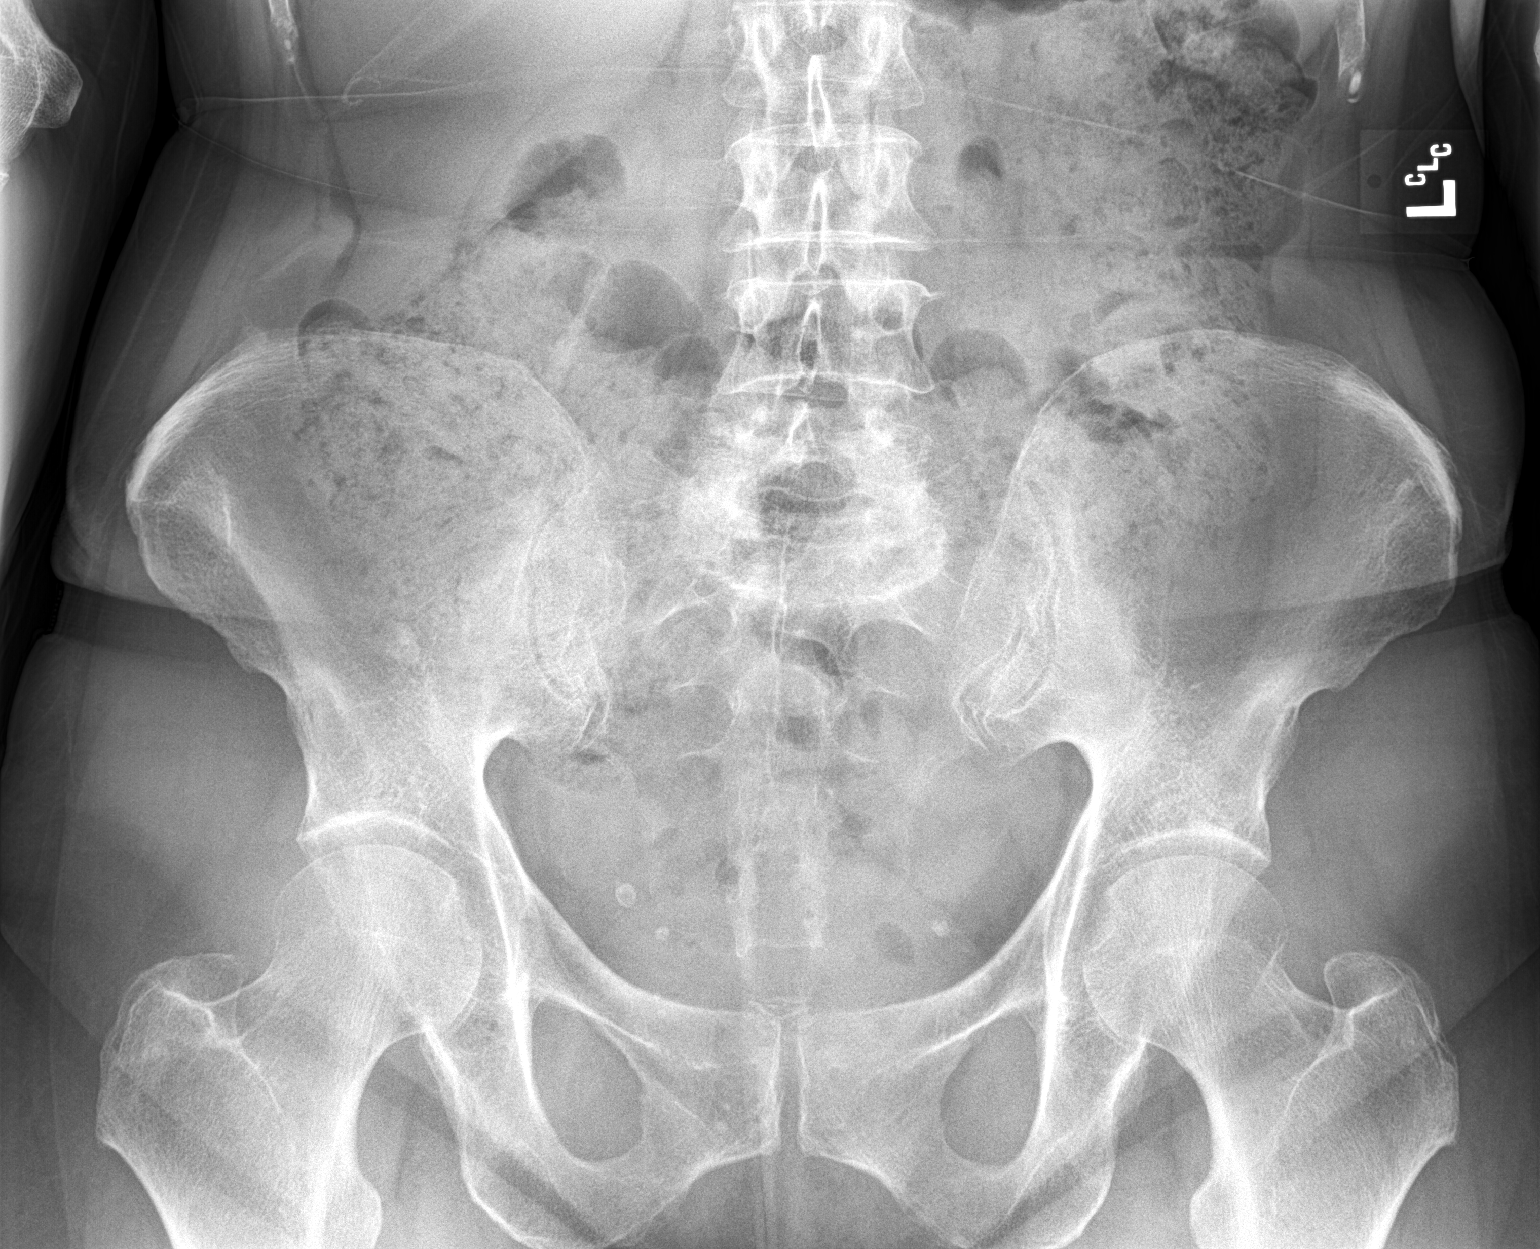

[hip ap]
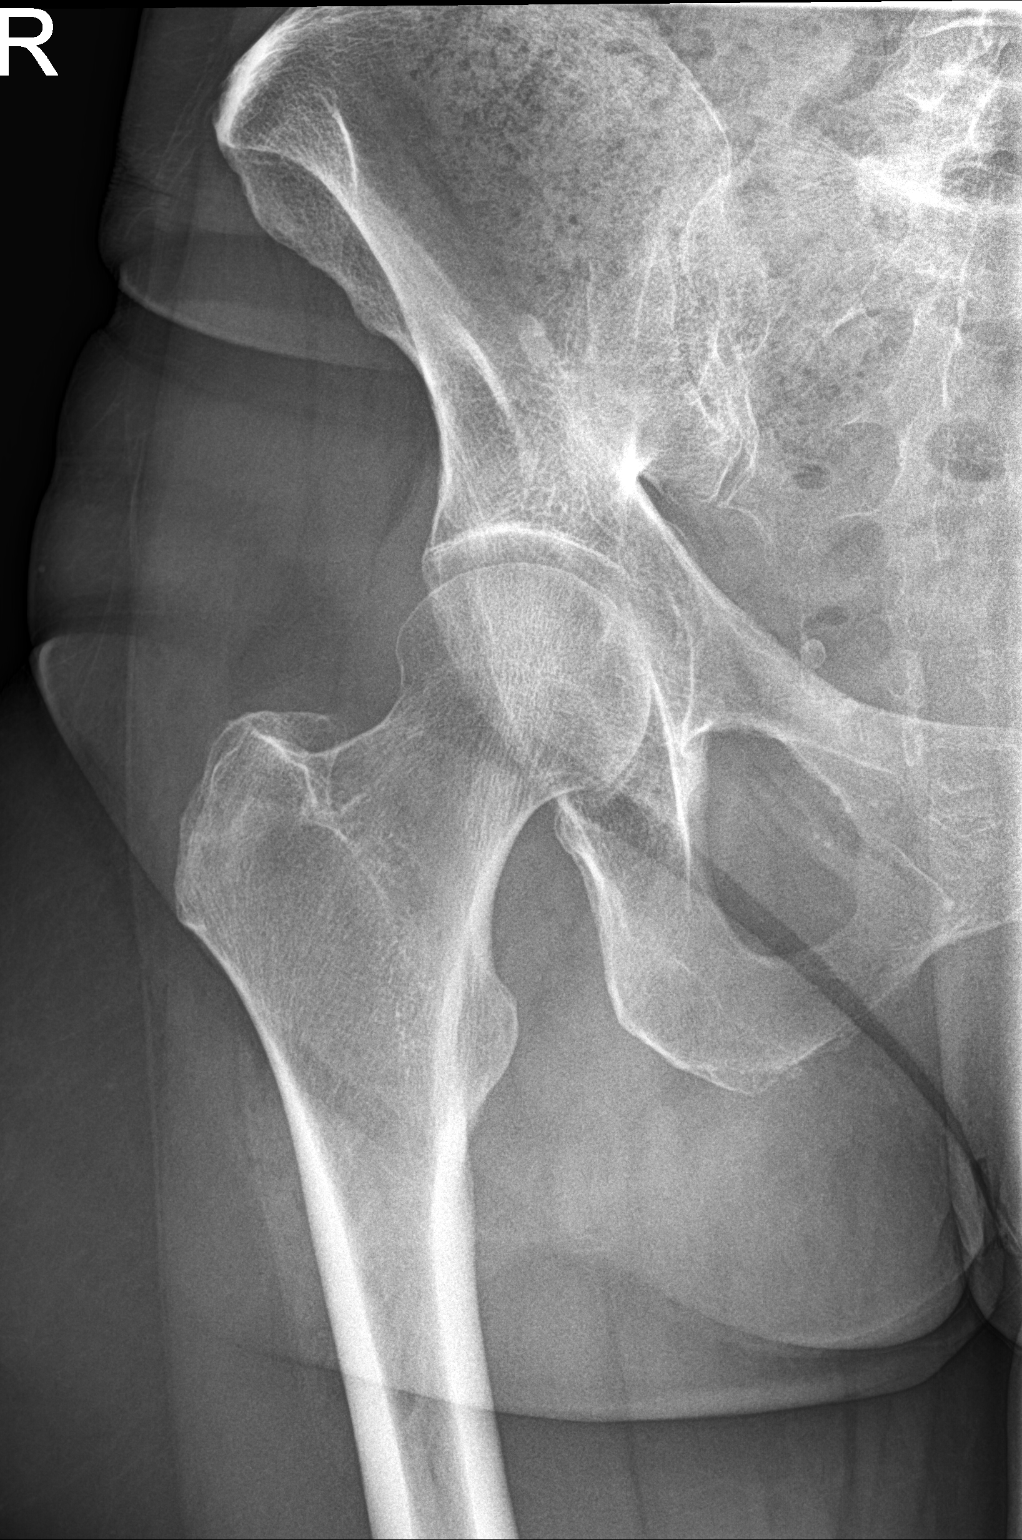

[hip lat]
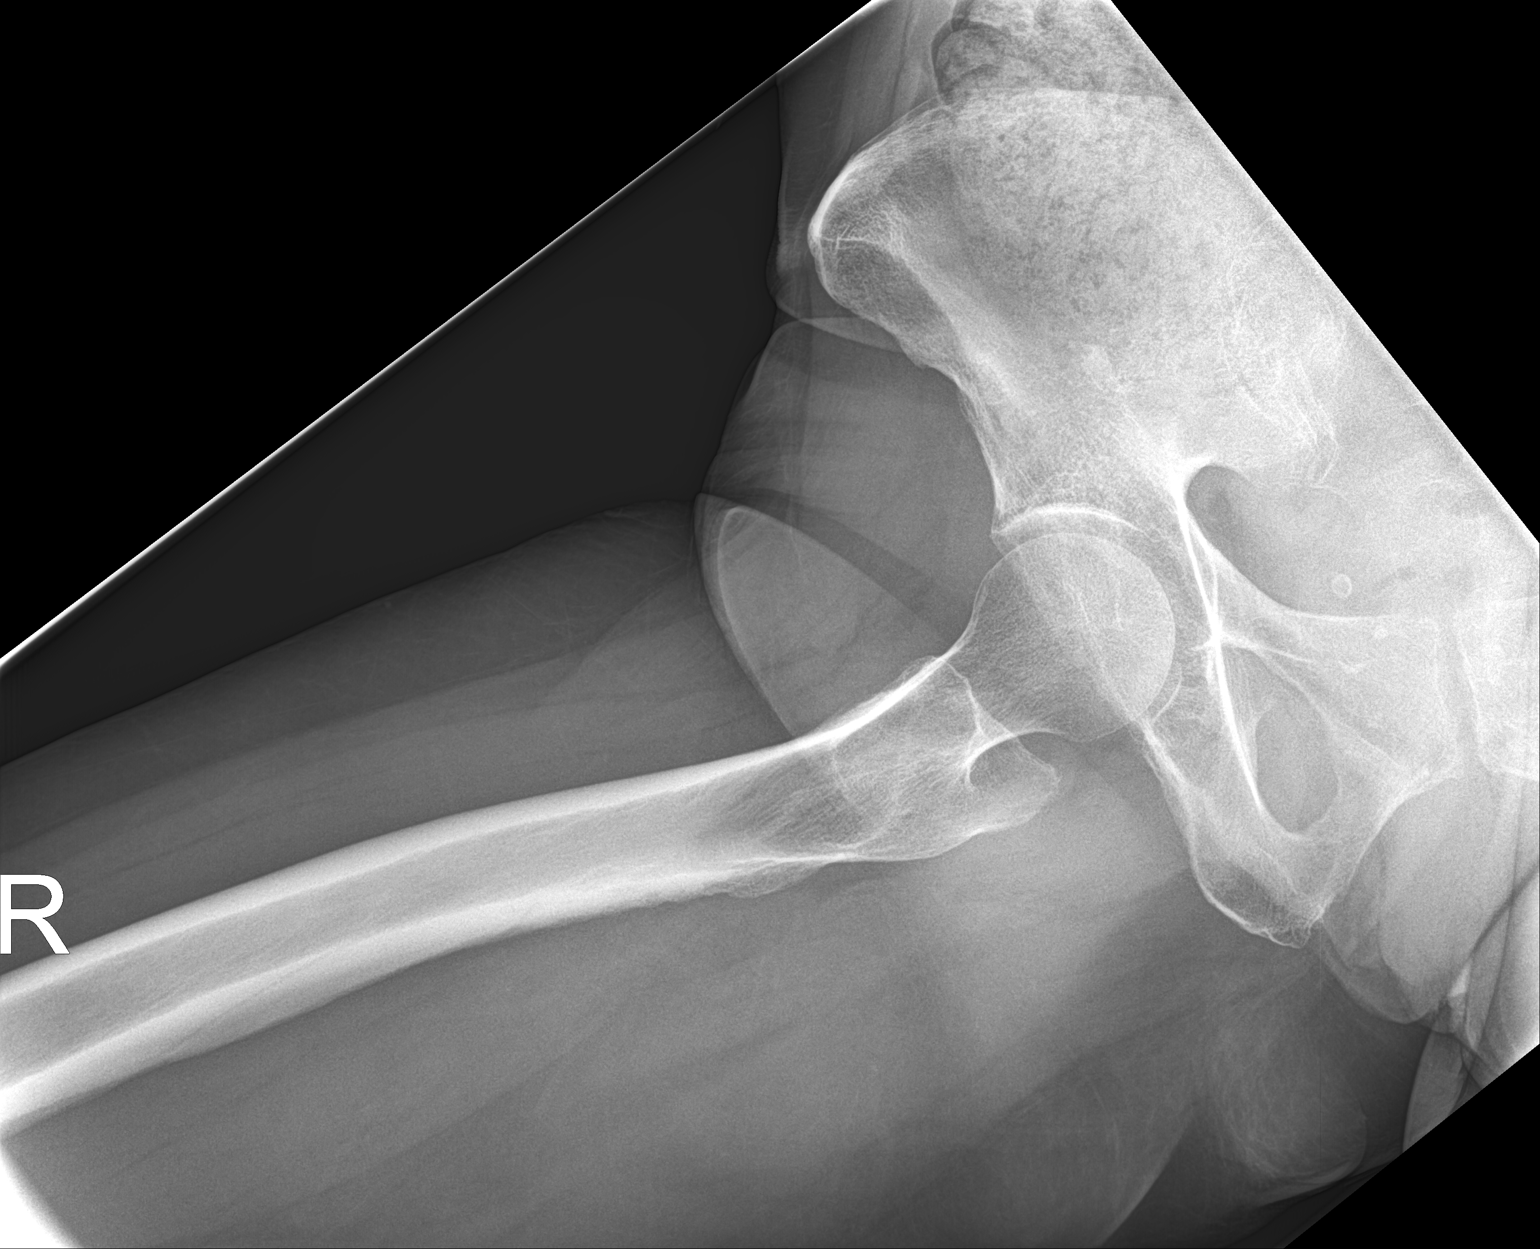

[3 of 3 positions shown; findings below may reference images not displayed]

FINDINGS: Femoral heads are located. Large colonic stool burden. Probable
phleboliths in the pelvis. sacroiliac joints are symmetric. No acute
fracture.
IMPRESSION: No acute osseous abnormality.

Possible constipation.

## 2021-06-10 DIAGNOSIS — H02839 Dermatochalasis of unspecified eye, unspecified eyelid: Secondary | ICD-10-CM | POA: Diagnosis not present

## 2021-06-10 DIAGNOSIS — H01009 Unspecified blepharitis unspecified eye, unspecified eyelid: Secondary | ICD-10-CM | POA: Diagnosis not present

## 2021-06-10 DIAGNOSIS — H40033 Anatomical narrow angle, bilateral: Secondary | ICD-10-CM | POA: Diagnosis not present

## 2021-06-10 DIAGNOSIS — H25812 Combined forms of age-related cataract, left eye: Secondary | ICD-10-CM | POA: Diagnosis not present

## 2021-06-18 ENCOUNTER — Other Ambulatory Visit: Payer: Self-pay | Admitting: Family Medicine

## 2021-06-18 DIAGNOSIS — Z1231 Encounter for screening mammogram for malignant neoplasm of breast: Secondary | ICD-10-CM

## 2021-07-15 ENCOUNTER — Ambulatory Visit (INDEPENDENT_AMBULATORY_CARE_PROVIDER_SITE_OTHER): Payer: Medicare HMO

## 2021-07-15 ENCOUNTER — Ambulatory Visit (INDEPENDENT_AMBULATORY_CARE_PROVIDER_SITE_OTHER): Payer: Medicare HMO | Admitting: Family Medicine

## 2021-07-15 ENCOUNTER — Encounter: Payer: Self-pay | Admitting: Family Medicine

## 2021-07-15 VITALS — BP 119/65 | HR 55 | Temp 97.1°F | Ht 66.0 in | Wt 167.1 lb

## 2021-07-15 DIAGNOSIS — Z0001 Encounter for general adult medical examination with abnormal findings: Secondary | ICD-10-CM

## 2021-07-15 DIAGNOSIS — Z23 Encounter for immunization: Secondary | ICD-10-CM

## 2021-07-15 DIAGNOSIS — Z780 Asymptomatic menopausal state: Secondary | ICD-10-CM | POA: Diagnosis not present

## 2021-07-15 DIAGNOSIS — H02839 Dermatochalasis of unspecified eye, unspecified eyelid: Secondary | ICD-10-CM | POA: Insufficient documentation

## 2021-07-15 DIAGNOSIS — J309 Allergic rhinitis, unspecified: Secondary | ICD-10-CM | POA: Diagnosis not present

## 2021-07-15 DIAGNOSIS — E559 Vitamin D deficiency, unspecified: Secondary | ICD-10-CM | POA: Diagnosis not present

## 2021-07-15 DIAGNOSIS — Z0000 Encounter for general adult medical examination without abnormal findings: Secondary | ICD-10-CM

## 2021-07-15 DIAGNOSIS — H25812 Combined forms of age-related cataract, left eye: Secondary | ICD-10-CM | POA: Insufficient documentation

## 2021-07-15 DIAGNOSIS — R6889 Other general symptoms and signs: Secondary | ICD-10-CM | POA: Diagnosis not present

## 2021-07-15 DIAGNOSIS — H40039 Anatomical narrow angle, unspecified eye: Secondary | ICD-10-CM | POA: Insufficient documentation

## 2021-07-15 MED ORDER — LEVOCETIRIZINE DIHYDROCHLORIDE 5 MG PO TABS: 5.0000 mg | ORAL_TABLET | Freq: Every evening | ORAL | 3 refills | Status: DC

## 2021-07-15 NOTE — Progress Notes (Signed)
? ?Ashlee Maldonado is a 66 y.o. female presents to office today for annual physical exam examination.   ? ?Concerns today include: ?1. Sinus congestion on and off for the last few months. She also reports ear popping. She has taken flonase off and on.  ? ?Last eye exam: recently- will be having cataract surgery soon ?Last colonoscopy: 2021 ?Last mammogram: scheduled for 08/11/21 ? ?She had a half cup of coffee with cream about 1.5 hours ago.  ? ?She has been taking a multivitamin.  ? ? ?  07/15/2021  ?  9:16 AM 06/09/2020  ?  2:50 PM 04/30/2019  ? 11:16 AM  ?Depression screen PHQ 2/9  ?Decreased Interest 0 0 0  ?Down, Depressed, Hopeless 0 0 0  ?PHQ - 2 Score 0 0 0  ?Altered sleeping 0    ?Tired, decreased energy 0    ?Change in appetite 0    ?Feeling bad or failure about yourself  0    ?Trouble concentrating 0    ?Moving slowly or fidgety/restless 0    ?Suicidal thoughts 0    ?PHQ-9 Score 0    ?Difficult doing work/chores Not difficult at all    ? ? ?  07/15/2021  ?  9:17 AM 03/07/2015  ?  1:36 PM  ?GAD 7 : Generalized Anxiety Score  ?Nervous, Anxious, on Edge 0 1  ?Control/stop worrying 0 0  ?Worry too much - different things 0 0  ?Trouble relaxing 0 0  ?Restless 0 0  ?Easily annoyed or irritable 0 0  ?Afraid - awful might happen 0 0  ?Total GAD 7 Score 0 1  ?Anxiety Difficulty Not difficult at all   ? ? ? ? ?No past medical history on file. ?Social History  ? ?Socioeconomic History  ? Marital status: Divorced  ?  Spouse name: Not on file  ? Number of children: Not on file  ? Years of education: Not on file  ? Highest education level: Not on file  ?Occupational History  ? Not on file  ?Tobacco Use  ? Smoking status: Former  ?  Packs/day: 0.50  ?  Types: Cigarettes  ?  Start date: 04/19/1977  ?  Quit date: 04/20/1991  ?  Years since quitting: 30.2  ? Smokeless tobacco: Never  ?Vaping Use  ? Vaping Use: Never used  ?Substance and Sexual Activity  ? Alcohol use: Yes  ?  Alcohol/week: 0.0 standard drinks  ?  Comment: rare  ?  Drug use: No  ? Sexual activity: Not on file  ?Other Topics Concern  ? Not on file  ?Social History Narrative  ? Not on file  ? ?Social Determinants of Health  ? ?Financial Resource Strain: Not on file  ?Food Insecurity: Not on file  ?Transportation Needs: Not on file  ?Physical Activity: Not on file  ?Stress: Not on file  ?Social Connections: Not on file  ?Intimate Partner Violence: Not on file  ? ?Past Surgical History:  ?Procedure Laterality Date  ? NASAL SINUS SURGERY    ? TUBAL LIGATION    ? ?Family History  ?Problem Relation Age of Onset  ? Cancer Mother   ? Heart disease Father   ? Heart disease Sister   ? Hyperlipidemia Sister   ? ? ?Current Outpatient Medications:  ?  fluticasone (FLONASE) 50 MCG/ACT nasal spray, 1 spray into each nostril daily., Disp: , Rfl:  ? ?Allergies  ?Allergen Reactions  ? Other Nausea And Vomiting  ?  Says most pain medications  cause nausea & vomiting.  ? Codeine Nausea And Vomiting  ? Cyclobenzaprine Other (See Comments)  ?  Hives  ? Meloxicam Hives  ?  ? ?ROS: ?Review of Systems ?Pertinent items noted in HPI and remainder of comprehensive ROS otherwise negative.   ? ?Physical exam ?BP 119/65   Pulse (!) 55   Temp (!) 97.1 ?F (36.2 ?C) (Temporal)   Ht '5\' 6"'  (1.676 m)   Wt 167 lb 2 oz (75.8 kg)   BMI 26.97 kg/m?  ?General appearance: alert, cooperative, and no distress ?Head: Normocephalic, without obvious abnormality, atraumatic ?Eyes: conjunctivae/corneas clear. PERRL, EOM's intact. Fundi benign. ?Ears: normal TM's and external ear canals both ears ?Nose: Nares normal. Septum midline. Mucosa normal. No drainage or sinus tenderness. ?Throat: lips, mucosa, and tongue normal; teeth and gums normal ?Neck: no adenopathy, no carotid bruit, no JVD, supple, symmetrical, trachea midline, and thyroid not enlarged, symmetric, no tenderness/mass/nodules ?Back: symmetric, no curvature. ROM normal. No CVA tenderness. ?Lungs: clear to auscultation bilaterally ?Heart: regular rate and  rhythm, S1, S2 normal, no murmur, click, rub or gallop ?Abdomen: soft, non-tender; bowel sounds normal; no masses,  no organomegaly ?Extremities: extremities normal, atraumatic, no cyanosis or edema ?Pulses: 2+ and symmetric ?Skin: Skin color, texture, turgor normal. No rashes or lesions ?Lymph nodes: Cervical, supraclavicular, and axillary nodes normal. ?Neurologic: Alert and oriented X 3, normal strength and tone. Normal symmetric reflexes. Normal coordination and gait  ? ? ?Assessment/ Plan: ?Ashlee Maldonado here for annual physical exam.  ? ?Ashlee Maldonado was seen today for annual exam. ? ?Diagnoses and all orders for this visit: ? ?Routine general medical examination at a health care facility ?Labs pending as below. She did have a half cup of coffee with cream about 1.5 hours prior.  ?-     CBC with Differential/Platelet ?-     CMP14+EGFR ?-     Thyroid Panel With TSH ? ?Vitamin D deficiency ?On supplement.  ?-     VITAMIN D 25 Hydroxy (Vit-D Deficiency, Fractures) ? ?Need for vaccination ?-     Pneumococcal conjugate vaccine 20-valent (Prevnar 20) ? ?Postmenopausal ?-     DG [facility] DEXA; Future ? ?Chronic allergic rhinitis ?Try xyzal. Can continue flonase.  ?-     levocetirizine (XYZAL) 5 MG tablet; Take 1 tablet (5 mg total) by mouth every evening. ? ? ?Counseled on healthy lifestyle choices, including diet (rich in fruits, vegetables and lean meats and low in salt and simple carbohydrates) and exercise (at least 30 minutes of moderate physical activity daily). ? ?Patient to follow up in 1 year for annual exam or sooner if needed. ? ?The above assessment and management plan was discussed with the patient. The patient verbalized understanding of and has agreed to the management plan. Patient is aware to call the clinic if symptoms persist or worsen. Patient is aware when to return to the clinic for a follow-up visit. Patient educated on when it is appropriate to go to the emergency department.  ? ?Ashlee Smolder,  FNP-C ?[facility] ?[address] ?[place], [place] [address] ?[phone] ? ? ? ? ? ? ?

## 2021-07-15 NOTE — Patient Instructions (Signed)

## 2021-07-16 ENCOUNTER — Other Ambulatory Visit: Payer: Self-pay | Admitting: Family Medicine

## 2021-07-16 DIAGNOSIS — E559 Vitamin D deficiency, unspecified: Secondary | ICD-10-CM

## 2021-07-16 DIAGNOSIS — Z780 Asymptomatic menopausal state: Secondary | ICD-10-CM | POA: Diagnosis not present

## 2021-07-16 DIAGNOSIS — M8588 Other specified disorders of bone density and structure, other site: Secondary | ICD-10-CM | POA: Diagnosis not present

## 2021-07-16 DIAGNOSIS — M810 Age-related osteoporosis without current pathological fracture: Secondary | ICD-10-CM | POA: Diagnosis not present

## 2021-07-16 LAB — CMP14+EGFR
ALT: 14 IU/L (ref 0–32)
AST: 15 IU/L (ref 0–40)
Albumin/Globulin Ratio: 2.2 (ref 1.2–2.2)
Albumin: 4.1 g/dL (ref 3.8–4.8)
Alkaline Phosphatase: 49 IU/L (ref 44–121)
BUN/Creatinine Ratio: 27 (ref 12–28)
BUN: 24 mg/dL (ref 8–27)
Bilirubin Total: 0.5 mg/dL (ref 0.0–1.2)
CO2: 24 mmol/L (ref 20–29)
Calcium: 9.4 mg/dL (ref 8.7–10.3)
Chloride: 106 mmol/L (ref 96–106)
Creatinine, Ser: 0.9 mg/dL (ref 0.57–1.00)
Globulin, Total: 1.9 g/dL (ref 1.5–4.5)
Glucose: 90 mg/dL (ref 70–99)
Potassium: 4.2 mmol/L (ref 3.5–5.2)
Sodium: 143 mmol/L (ref 134–144)
Total Protein: 6 g/dL (ref 6.0–8.5)
eGFR: 71 mL/min/{1.73_m2} (ref >59)

## 2021-07-16 LAB — CBC WITH DIFFERENTIAL/PLATELET
Basophils Absolute: 0.1 10*3/uL (ref 0.0–0.2)
Basos: 1 %
EOS (ABSOLUTE): 0.1 10*3/uL (ref 0.0–0.4)
Eos: 3 %
Hematocrit: 38.5 % (ref 34.0–46.6)
Hemoglobin: 12.9 g/dL (ref 11.1–15.9)
Immature Grans (Abs): 0 10*3/uL (ref 0.0–0.1)
Immature Granulocytes: 0 %
Lymphocytes Absolute: 2.4 10*3/uL (ref 0.7–3.1)
Lymphs: 49 %
MCH: 29.5 pg (ref 26.6–33.0)
MCHC: 33.5 g/dL (ref 31.5–35.7)
MCV: 88 fL (ref 79–97)
Monocytes Absolute: 0.4 10*3/uL (ref 0.1–0.9)
Monocytes: 8 %
Neutrophils Absolute: 1.9 10*3/uL (ref 1.4–7.0)
Neutrophils: 39 %
Platelets: 206 10*3/uL (ref 150–450)
RBC: 4.37 x10E6/uL (ref 3.77–5.28)
RDW: 12.3 % (ref 11.7–15.4)
WBC: 4.9 10*3/uL (ref 3.4–10.8)

## 2021-07-16 LAB — THYROID PANEL WITH TSH
Free Thyroxine Index: 1.6 (ref 1.2–4.9)
T3 Uptake Ratio: 27 % (ref 24–39)
T4, Total: 5.9 ug/dL (ref 4.5–12.0)
TSH: 1.78 u[IU]/mL (ref 0.450–4.500)

## 2021-07-16 LAB — VITAMIN D 25 HYDROXY (VIT D DEFICIENCY, FRACTURES): Vit D, 25-Hydroxy: 31.7 ng/mL (ref 30.0–100.0)

## 2021-07-16 MED ORDER — VITAMIN D (ERGOCALCIFEROL) 1.25 MG (50000 UNIT) PO CAPS: 50000.0000 [IU] | ORAL_CAPSULE | ORAL | 0 refills | Status: DC

## 2021-07-20 ENCOUNTER — Other Ambulatory Visit: Payer: Self-pay | Admitting: Family Medicine

## 2021-07-20 DIAGNOSIS — M810 Age-related osteoporosis without current pathological fracture: Secondary | ICD-10-CM

## 2021-07-20 MED ORDER — ALENDRONATE SODIUM 70 MG PO TABS: 70.0000 mg | ORAL_TABLET | ORAL | 3 refills | Status: DC

## 2021-07-22 ENCOUNTER — Ambulatory Visit
Admission: RE | Admit: 2021-07-22 | Discharge: 2021-07-22 | Disposition: A | Discharge status: Home / Self Care | Payer: Medicare HMO | Source: Ambulatory Visit | Attending: Family Medicine | Admitting: Family Medicine

## 2021-07-22 DIAGNOSIS — Z1231 Encounter for screening mammogram for malignant neoplasm of breast: Secondary | ICD-10-CM

## 2021-07-29 DIAGNOSIS — H02889 Meibomian gland dysfunction of unspecified eye, unspecified eyelid: Secondary | ICD-10-CM | POA: Diagnosis not present

## 2021-07-29 DIAGNOSIS — H25813 Combined forms of age-related cataract, bilateral: Secondary | ICD-10-CM | POA: Diagnosis not present

## 2021-07-29 DIAGNOSIS — H02839 Dermatochalasis of unspecified eye, unspecified eyelid: Secondary | ICD-10-CM | POA: Diagnosis not present

## 2021-08-04 DIAGNOSIS — H524 Presbyopia: Secondary | ICD-10-CM | POA: Diagnosis not present

## 2021-08-13 DIAGNOSIS — H02834 Dermatochalasis of left upper eyelid: Secondary | ICD-10-CM | POA: Diagnosis not present

## 2021-08-13 DIAGNOSIS — H02831 Dermatochalasis of right upper eyelid: Secondary | ICD-10-CM | POA: Diagnosis not present

## 2021-09-02 ENCOUNTER — Other Ambulatory Visit: Payer: Self-pay | Admitting: *Deleted

## 2021-09-02 DIAGNOSIS — J309 Allergic rhinitis, unspecified: Secondary | ICD-10-CM

## 2021-09-02 DIAGNOSIS — M810 Age-related osteoporosis without current pathological fracture: Secondary | ICD-10-CM

## 2021-09-02 MED ORDER — LEVOCETIRIZINE DIHYDROCHLORIDE 5 MG PO TABS: 5.0000 mg | ORAL_TABLET | Freq: Every evening | ORAL | 3 refills | Status: AC

## 2021-09-02 MED ORDER — ALENDRONATE SODIUM 70 MG PO TABS: 70.0000 mg | ORAL_TABLET | ORAL | 3 refills | Status: DC

## 2021-10-22 DIAGNOSIS — H02834 Dermatochalasis of left upper eyelid: Secondary | ICD-10-CM | POA: Diagnosis not present

## 2021-10-22 DIAGNOSIS — M810 Age-related osteoporosis without current pathological fracture: Secondary | ICD-10-CM | POA: Diagnosis not present

## 2021-10-22 DIAGNOSIS — H02831 Dermatochalasis of right upper eyelid: Secondary | ICD-10-CM | POA: Diagnosis not present

## 2021-10-22 DIAGNOSIS — M1990 Unspecified osteoarthritis, unspecified site: Secondary | ICD-10-CM | POA: Diagnosis not present

## 2021-10-22 HISTORY — PX: BLEPHAROPLASTY: SUR158

## 2022-01-18 ENCOUNTER — Ambulatory Visit (INDEPENDENT_AMBULATORY_CARE_PROVIDER_SITE_OTHER): Payer: Medicare HMO | Admitting: Family Medicine

## 2022-01-18 ENCOUNTER — Encounter: Payer: Self-pay | Admitting: Family Medicine

## 2022-01-18 VITALS — BP 98/61 | HR 48 | Temp 97.8°F | Ht 66.0 in | Wt 162.2 lb

## 2022-01-18 DIAGNOSIS — Z0000 Encounter for general adult medical examination without abnormal findings: Secondary | ICD-10-CM

## 2022-01-18 DIAGNOSIS — Z23 Encounter for immunization: Secondary | ICD-10-CM | POA: Diagnosis not present

## 2022-01-18 DIAGNOSIS — I8392 Asymptomatic varicose veins of left lower extremity: Secondary | ICD-10-CM

## 2022-01-18 NOTE — Progress Notes (Signed)
Subjective:    Ashlee Maldonado is a 65 y.o. female who presents for a Welcome to Medicare exam.   She would like to discuss a referral for her varicose veins. She has a very large varicose vein on the back of her left thigh. Reports that this has been stable for years. Denies warmth, erythema, swelling, or pain.   Review of Systems Denies shortness of breath, edema, chest pain, shortness of breath, dizziness, fatigue, or weakness.  Cardiac Risk Factors include: advanced age (>72mn, >>65women)      Objective:    Today's Vitals   01/18/22 1139  BP: 98/61  Pulse: (!) 48  Temp: 97.8 F (36.6 C)  TempSrc: Temporal  SpO2: 95%  Weight: 162 lb 4 oz (73.6 kg)  Height: '5\' 6"'$  (1.676 m)  Body mass index is 26.19 kg/m.  Medications Outpatient Encounter Medications as of 01/18/2022  Medication Sig   alendronate (FOSAMAX) 70 MG tablet Take 1 tablet (70 mg total) by mouth every 7 (seven) days. Take with a full glass of water on an empty stomach.   cholecalciferol (VITAMIN D3) 25 MCG (1000 UNIT) tablet Take 1,000 Units by mouth daily.   fluticasone (FLONASE) 50 MCG/ACT nasal spray 1 spray into each nostril daily.   levocetirizine (XYZAL) 5 MG tablet Take 1 tablet (5 mg total) by mouth every evening.   [DISCONTINUED] Vitamin D, Ergocalciferol, (DRISDOL) 1.25 MG (50000 UNIT) CAPS capsule Take 1 capsule (50,000 Units total) by mouth every 7 (seven) days.   No facility-administered encounter medications on file as of 01/18/2022.     History: Past Medical History:  Diagnosis Date   Osteoporosis    Past Surgical History:  Procedure Laterality Date   BLEPHAROPLASTY Bilateral 10/22/2021   NASAL SINUS SURGERY     TUBAL LIGATION      Family History  Problem Relation Age of Onset   Cancer Mother    Heart disease Father    Heart disease Sister    Hyperlipidemia Sister    Breast cancer Neg Hx    Social History   Occupational History   Occupation: retired  Tobacco Use   Smoking status:  Former    Packs/day: 0.50    Types: Cigarettes    Start date: 04/19/1977    Quit date: 04/20/1991    Years since quitting: 30.7   Smokeless tobacco: Never  Vaping Use   Vaping Use: Never used  Substance and Sexual Activity   Alcohol use: Not Currently    Comment: rare   Drug use: No   Sexual activity: Not Currently    Tobacco Counseling Counseling given: Not Answered   Immunizations and Health Maintenance Immunization History  Administered Date(s) Administered   Influenza,inj,Quad PF,6+ Mos 03/07/2015, 01/29/2016, 01/22/2019   Influenza,inj,quad, With Preservative 01/29/2016   Influenza-Unspecified 01/15/2020, 02/10/2021   Moderna Sars-Covid-2 Vaccination 07/07/2019, 08/11/2019   PFIZER(Purple Top)SARS-COV-2 Vaccination 03/07/2020   PNEUMOCOCCAL CONJUGATE-20 07/15/2021   PPD Test 01/29/2016   Tdap 10/24/2018   Health Maintenance Due  Topic Date Due   INFLUENZA VACCINE  11/17/2021    Activities of Daily Living    01/18/2022   11:31 AM  In your present state of health, do you have any difficulty performing the following activities:  Hearing? 0  Vision? 0  Comment wears rx glasses all the time-up to date on eye exam-last month  Difficulty concentrating or making decisions? 0  Walking or climbing stairs? 0  Dressing or bathing? 0  Doing errands, shopping? 0  Preparing  Food and eating ? N  Using the Toilet? N  In the past six months, have you accidently leaked urine? N  Do you have problems with loss of bowel control? N  Managing your Medications? N  Managing your Finances? N  Housekeeping or managing your Housekeeping? N   Physical Exam   Left leg: large varicose vein to posterior thigh. No tenderness, warmth, swelling, or edema.   Advanced Directives: Does Patient Have a Medical Advance Directive?: No Would patient like information on creating a medical advance directive?: Yes (MAU/Ambulatory/Procedural Areas - Information given)    Assessment:    This is a  routine wellness examination for this patient .   Ashlee Maldonado was seen today for [employer].  Diagnoses and all orders for this visit:  Welcome to Medicare preventive visit EKG with SR today.  -     EKG 12-Lead  Vision/Hearing screen Eye exam within last year.  Dietary issues and exercise activities discussed:  Current Exercise Habits: Home exercise routine, Type of exercise: treadmill;walking, Time (Minutes): 30, Frequency (Times/Week): 7, Weekly Exercise (Minutes/Week): 210, Intensity: Moderate, Exercise limited by: None identified   Goals       Weight (lb) < 147 lb (66.7 kg) (pt-stated)      She would like to lose 10-15 lbs. She plans to exercise daily for 30 minutes with moderate intensity exercise.        Depression Screen    01/18/2022   11:30 AM 07/15/2021    9:16 AM 06/09/2020    2:50 PM 04/30/2019   11:16 AM  PHQ 2/9 Scores  PHQ - 2 Score 0 0 0 0  PHQ- 9 Score 0 0       Fall Risk    01/18/2022   11:30 AM  [place] in the past year? 0    Cognitive Function:    01/18/2022   11:37 AM  MMSE - Mini Mental State Exam  Orientation to time 5  Orientation to Place 5  Registration 3  Attention/ Calculation 5  Recall 2  Language- name 2 objects 2  Language- repeat 1  Language- follow 3 step command 3  Language- read & follow direction 1  Write a sentence 1  Copy design 1  Total score 29        Patient Care Team: Ashlee Perking, FNP as PCP - General (Family Medicine)     Plan:    This is a routine wellness examination for this patient .   Ashlee Maldonado was seen today for [employer].  Diagnoses and all orders for this visit:  Welcome to Medicare preventive visit EKG with SR today.  -     EKG 12-Lead  Need for immunization against influenza Flu vaccine today.   Varicose veins of left thigh -     Ambulatory referral to Vascular Surgery  Vision/Hearing screen Eye exam within last year.   I have personally reviewed and noted  the following in the patient's chart:   Medical and social history Use of alcohol, tobacco or illicit drugs  Current medications and supplements Functional ability and status Nutritional status Physical activity Advanced directives List of other physicians Hospitalizations, surgeries, and ER visits in previous 12 months Vitals Screenings to include cognitive, depression, and falls Referrals and appointments  In addition, I have reviewed and discussed with patient certain preventive protocols, quality metrics, and best practice recommendations. A written personalized care plan for preventive services as well as general preventive health recommendations  were provided to patient.     Ashlee Perking, FNP 01/18/2022

## 2022-01-18 NOTE — Patient Instructions (Signed)

## 2022-02-01 ENCOUNTER — Other Ambulatory Visit: Payer: Self-pay | Admitting: *Deleted

## 2022-02-01 DIAGNOSIS — I8393 Asymptomatic varicose veins of bilateral lower extremities: Secondary | ICD-10-CM

## 2022-02-05 ENCOUNTER — Encounter: Payer: Self-pay | Admitting: *Deleted

## 2022-02-08 NOTE — Progress Notes (Deleted)
Requested by:  Gwenlyn Perking, FNP [place],  [place] [id number]  Reason for consultation: ***    History of Present Illness   Ashlee Maldonado is a 66 y.o. (04-14-1956) female who presents for evaluation of ***  Venous symptoms include: (aching, heavy, tired, throbbing, burning, itching, swelling, bleeding, ulcer)  *** Onset/duration:  ***  Occupation:  *** Aggravating factors: (sitting, standing) Alleviating factors: (elevation) Compression:  *** Helps:  *** Pain medications:  *** Previous vein procedures:  *** History of DVT:  ***  Past Medical History:  Diagnosis Date   Osteoporosis     Past Surgical History:  Procedure Laterality Date   BLEPHAROPLASTY Bilateral 10/22/2021   NASAL SINUS SURGERY     TUBAL LIGATION      Social History   Socioeconomic History   Marital status: Divorced    Spouse name: Not on file   Number of children: 2   Years of education: 12   Highest education level: High school graduate  Occupational History   Occupation: retired  Tobacco Use   Smoking status: Former    Packs/day: 0.50    Types: Cigarettes    Start date: 04/19/1977    Quit date: 04/20/1991    Years since quitting: 30.8   Smokeless tobacco: Never  Vaping Use   Vaping Use: Never used  Substance and Sexual Activity   Alcohol use: Not Currently    Comment: rare   Drug use: No   Sexual activity: Not Currently  Other Topics Concern   Not on file  Social History Narrative   Not on file   Social Determinants of Health   Financial Resource Strain: Low Risk  (01/18/2022)   Overall Financial Resource Strain (CARDIA)    Difficulty of Paying Living Expenses: Not hard at all  Food Insecurity: No Food Insecurity (01/18/2022)   Hunger Vital Sign    Worried About Running Out of Food in the Last Year: Never true    Ran Out of Food in the Last Year: Never true  Transportation Needs: No Transportation Needs (01/18/2022)   PRAPARE - Radiographer, therapeutic (Medical): No    Lack of Transportation (Non-Medical): No  Physical Activity: Sufficiently Active (01/18/2022)   Exercise Vital Sign    Days of Exercise per Week: 7 days    Minutes of Exercise per Session: 30 min  Stress: No Stress Concern Present (01/18/2022)   [place]    Feeling of Stress : Not at all  Social Connections: Moderately Integrated (01/18/2022)   Social Connection and Isolation Panel [NHANES]    Frequency of Communication with Friends and Family: More than three times a week    Frequency of Social Gatherings with Friends and Family: More than three times a week    Attends Religious Services: More than 4 times per year    Active Member of [employer] or Organizations: Yes    Attends Music therapist: More than 4 times per year    Marital Status: Divorced  Intimate Partner Violence: Not At Risk (01/18/2022)   Humiliation, Afraid, Rape, and Kick questionnaire    Fear of Current or Ex-Partner: No    Emotionally Abused: No    Physically Abused: No    Sexually Abused: No   *** Family History  Problem Relation Age of Onset   Cancer Mother    Heart disease Father    Heart disease Sister  Hyperlipidemia Sister    Breast cancer Neg Hx     Current Outpatient Medications  Medication Sig Dispense Refill   alendronate (FOSAMAX) 70 MG tablet Take 1 tablet (70 mg total) by mouth every 7 (seven) days. Take with a full glass of water on an empty stomach. 12 tablet 3   cholecalciferol (VITAMIN D3) 25 MCG (1000 UNIT) tablet Take 1,000 Units by mouth daily.     fluticasone (FLONASE) 50 MCG/ACT nasal spray 1 spray into each nostril daily.     levocetirizine (XYZAL) 5 MG tablet Take 1 tablet (5 mg total) by mouth every evening. 90 tablet 3   No current facility-administered medications for this visit.    Allergies  Allergen Reactions   Other Nausea And Vomiting    Says most pain medications  cause nausea & vomiting.   Codeine Nausea And Vomiting   Cyclobenzaprine Other (See Comments)    Hives   Meloxicam Hives    ***REVIEW OF SYSTEMS (negative unless checked):   Cardiac:  '[]'$  Chest pain or chest pressure? '[]'$  Shortness of breath upon activity? '[]'$  Shortness of breath when lying flat? '[]'$  Irregular heart rhythm?  Vascular:  '[]'$  Pain in calf, thigh, or hip brought on by walking? '[]'$  Pain in feet at night that wakes you up from your sleep? '[]'$  Blood clot in your veins? '[]'$  Leg swelling?  Pulmonary:  '[]'$  Oxygen at home? '[]'$  Productive cough? '[]'$  Wheezing?  Neurologic:  '[]'$  Sudden weakness in arms or legs? '[]'$  Sudden numbness in arms or legs? '[]'$  Sudden onset of difficult speaking or slurred speech? '[]'$  Temporary loss of vision in one eye? '[]'$  Problems with dizziness?  Gastrointestinal:  '[]'$  Blood in stool? '[]'$  Vomited blood?  Genitourinary:  '[]'$  Burning when urinating? '[]'$  Blood in urine?  Psychiatric:  '[]'$  Major depression  Hematologic:  '[]'$  Bleeding problems? '[]'$  Problems with blood clotting?  Dermatologic:  '[]'$  Rashes or ulcers?  Constitutional:  '[]'$  Fever or chills?  Ear/Nose/Throat:  '[]'$  Change in hearing? '[]'$  Nose bleeds? '[]'$  Sore throat?  Musculoskeletal:  '[]'$  Back pain? '[]'$  Joint pain? '[]'$  Muscle pain?   Physical Examination    There were no vitals filed for this visit. There is no height or weight on file to calculate BMI.  General:  WDWN in NAD; vital signs documented above Gait: Not observed HENT: WNL, normocephalic Pulmonary: normal non-labored breathing , without Rales, rhonchi,  wheezing Cardiac: {Desc; regular/irreg:14544} HR, without  Murmurs {With/Without:20273} carotid bruit*** Abdomen: soft, NT, no masses Skin: {With/Without:20273} rashes Vascular Exam/Pulses:  Right Left  Radial {Exam; arterial pulse strength 0-4:30167} {Exam; arterial pulse strength 0-4:30167}  Ulnar {Exam; arterial pulse strength 0-4:30167} {Exam; arterial pulse strength  0-4:30167}  Femoral {Exam; arterial pulse strength 0-4:30167} {Exam; arterial pulse strength 0-4:30167}  Popliteal {Exam; arterial pulse strength 0-4:30167} {Exam; arterial pulse strength 0-4:30167}  DP {Exam; arterial pulse strength 0-4:30167} {Exam; arterial pulse strength 0-4:30167}  PT {Exam; arterial pulse strength 0-4:30167} {Exam; arterial pulse strength 0-4:30167}   Extremities: {With/Without:20273} varicose veins, {With/Without:20273} reticular veins, {With/Without:20273} edema, {With/Without:20273} stasis pigmentation, {With/Without:20273} lipodermatosclerosis, {With/Without:20273} ulcers Musculoskeletal: no muscle wasting or atrophy  Neurologic: A&O X 3;  No focal weakness or paresthesias are detected Psychiatric:  The pt has {Desc; normal/abnormal:11317::"Normal"} affect.  Non-invasive Vascular Imaging   BLE Venous Insufficiency Duplex (***):  RLE:  *** DVT and SVT,  *** GSV reflux ***, GSV diameter *** *** SSV reflux ***, *** deep venous reflux  LLE: *** DVT and SVT,  *** GSV reflux ***,  GSV diameter *** *** SSV reflux ***, *** deep venous reflux   Medical Decision Making   Lajoy Vanamburg is a 66 y.o. female who presents with: ***LE chronic venous insufficiency, ***varicose veins with complications  Based on the patient's history and examination, I recommend: ***. I discussed with the patient the use of her 20-30 mm thigh high compression stockings and need for 3 month trial of such. The patient will follow up in 3 months with Dr. Marland Kitchen Thank you for allowing [place] to participate in this patient's care.   Lorraine Cimmino Emmit Alexanders, PA-C [facility] Office: [phone]  02/08/2022, 9:34 PM  Clinic MD: ***

## 2022-02-09 ENCOUNTER — Ambulatory Visit (HOSPITAL_COMMUNITY): Payer: Medicare HMO

## 2022-02-18 ENCOUNTER — Ambulatory Visit (INDEPENDENT_AMBULATORY_CARE_PROVIDER_SITE_OTHER): Payer: Medicare HMO | Admitting: *Deleted

## 2022-02-18 DIAGNOSIS — Z23 Encounter for immunization: Secondary | ICD-10-CM

## 2022-03-29 ENCOUNTER — Ambulatory Visit: Payer: Medicare HMO | Admitting: Physician Assistant

## 2022-03-29 ENCOUNTER — Ambulatory Visit (HOSPITAL_COMMUNITY)
Admission: RE | Admit: 2022-03-29 | Discharge: 2022-03-29 | Disposition: A | Discharge status: Home / Self Care | Payer: Medicare HMO | Source: Ambulatory Visit | Attending: Physician Assistant | Admitting: Physician Assistant

## 2022-03-29 VITALS — BP 110/70 | HR 67 | Temp 97.7°F | Resp 16 | Ht 65.5 in | Wt 146.0 lb

## 2022-03-29 DIAGNOSIS — I8393 Asymptomatic varicose veins of bilateral lower extremities: Secondary | ICD-10-CM | POA: Insufficient documentation

## 2022-03-29 DIAGNOSIS — M7989 Other specified soft tissue disorders: Secondary | ICD-10-CM

## 2022-03-29 NOTE — Progress Notes (Unsigned)
Office Note     CC:  follow up Requesting Provider:  Gabriel Earing, FNP  HPI: Ashlee Maldonado is a 66 y.o. (1955/05/29) female who presents for evaluation of varicose vein of left lower extremity.  She does not have any concha mitten swelling, heavy feeling, or other venous symptoms of left lower extremity.  Her large varicosity of her posterior thigh is not tender to touch however she is concerned that if she were to sustain an injury to the back of her leg she would bleed profusely.  She states she lives alone and this is a concern of hers.  She does not wear compression and has no interest in wearing compression long-term.  She does not elevate her legs during the day.  She denies any history of DVT, venous ulcerations, trauma, or prior vascular intervention.  She denies tobacco use.  Past Medical History:  Diagnosis Date   Osteoporosis     Past Surgical History:  Procedure Laterality Date   BLEPHAROPLASTY Bilateral 10/22/2021   NASAL SINUS SURGERY     TUBAL LIGATION      Social History   Socioeconomic History   Marital status: Divorced    Spouse name: Not on file   Number of children: 2   Years of education: 12   Highest education level: High school graduate  Occupational History   Occupation: retired  Tobacco Use   Smoking status: Former    Packs/day: 0.50    Types: Cigarettes    Start date: 04/19/1977    Quit date: 04/20/1991    Years since quitting: 30.9   Smokeless tobacco: Never  Vaping Use   Vaping Use: Never used  Substance and Sexual Activity   Alcohol use: Not Currently    Comment: rare   Drug use: No   Sexual activity: Not Currently  Other Topics Concern   Not on file  Social History Narrative   Not on file   Social Determinants of Health   Financial Resource Strain: Low Risk  (01/18/2022)   Overall Financial Resource Strain (CARDIA)    Difficulty of Paying Living Expenses: Not hard at all  Food Insecurity: No Food Insecurity (01/18/2022)   Hunger  Vital Sign    Worried About Running Out of Food in the Last Year: Never true    Ran Out of Food in the Last Year: Never true  Transportation Needs: No Transportation Needs (01/18/2022)   PRAPARE - Administrator, Civil Service (Medical): No    Lack of Transportation (Non-Medical): No  Physical Activity: Sufficiently Active (01/18/2022)   Exercise Vital Sign    Days of Exercise per Week: 7 days    Minutes of Exercise per Session: 30 min  Stress: No Stress Concern Present (01/18/2022)   [employer] of Occupational Health - Occupational Stress Questionnaire    Feeling of Stress : Not at all  Social Connections: Moderately Integrated (01/18/2022)   Social Connection and Isolation Panel [NHANES]    Frequency of Communication with Friends and Family: More than three times a week    Frequency of Social Gatherings with Friends and Family: More than three times a week    Attends Religious Services: More than 4 times per year    Active Member of [employer] or Organizations: Yes    Attends Banker Meetings: More than 4 times per year    Marital Status: Divorced  Intimate Partner Violence: Not At Risk (01/18/2022)   Humiliation, Afraid, Rape, and Kick questionnaire  Fear of Current or Ex-Partner: No    Emotionally Abused: No    Physically Abused: No    Sexually Abused: No    Family History  Problem Relation Age of Onset   Cancer Mother    Heart disease Father    Heart disease Sister    Hyperlipidemia Sister    Breast cancer Neg Hx     Current Outpatient Medications  Medication Sig Dispense Refill   alendronate (FOSAMAX) 70 MG tablet Take 1 tablet (70 mg total) by mouth every 7 (seven) days. Take with a full glass of water on an empty stomach. 12 tablet 3   cholecalciferol (VITAMIN D3) 25 MCG (1000 UNIT) tablet Take 1,000 Units by mouth daily.     levocetirizine (XYZAL) 5 MG tablet Take 1 tablet (5 mg total) by mouth every evening. 90 tablet 3   fluticasone  (FLONASE) 50 MCG/ACT nasal spray 1 spray into each nostril daily.     No current facility-administered medications for this visit.    Allergies  Allergen Reactions   Other Nausea And Vomiting    Says most pain medications cause nausea & vomiting.   Codeine Nausea And Vomiting   Cyclobenzaprine Other (See Comments)    Hives   Meloxicam Hives     REVIEW OF SYSTEMS:   [X]  denotes positive finding, [ ]  denotes negative finding Cardiac  Comments:  Chest pain or chest pressure:    Shortness of breath upon exertion:    Short of breath when lying flat:    Irregular heart rhythm:        Vascular    Pain in calf, thigh, or hip brought on by ambulation:    Pain in feet at night that wakes you up from your sleep:     Blood clot in your veins:    Leg swelling:         Pulmonary    Oxygen at home:    Productive cough:     Wheezing:         Neurologic    Sudden weakness in arms or legs:     Sudden numbness in arms or legs:     Sudden onset of difficulty speaking or slurred speech:    Temporary loss of vision in one eye:     Problems with dizziness:         Gastrointestinal    Blood in stool:     Vomited blood:         Genitourinary    Burning when urinating:     Blood in urine:        Psychiatric    Major depression:         Hematologic    Bleeding problems:    Problems with blood clotting too easily:        Skin    Rashes or ulcers:        Constitutional    Fever or chills:      PHYSICAL EXAMINATION:  Vitals:   03/29/22 1451  BP: 110/70  Pulse: 67  Resp: 16  Temp: 97.7 F (36.5 C)  TempSrc: Temporal  SpO2: 97%  Weight: 146 lb (66.2 kg)  Height: 5' 5.5" (1.664 m)    General:  WDWN in NAD; vital signs documented above Gait: Not observed HENT: WNL, normocephalic Pulmonary: normal non-labored breathing , without Rales, rhonchi,  wheezing Cardiac: regular HR Abdomen: soft, NT, no masses Skin: without rashes Vascular Exam/Pulses:  Right Left   Radial 2+ (normal)  2+ (normal)  DP 2+ (normal) 2+ (normal)   Extremities: Stasis pigmentation changes of anterior shins; varicosities of the left proximal shin; large varicosity of the posterior left thigh pictured below Musculoskeletal: no muscle wasting or atrophy  Neurologic: A&O X 3;  No focal weakness or paresthesias are detected Psychiatric:  The pt has Normal affect.    Non-Invasive Vascular Imaging:   Left lower extremity venous reflux study negative for DVT Incompetent left common femoral vein and mid femoral vein Incompetent GSV at the level of the saphenofemoral junction Incompetent small saphenous vein in the proximal calf Unknown origin of the left posterior thigh varicosity    ASSESSMENT/PLAN:: 66 y.o. female here for evaluation of left lower extremity varicose veins  -Patient states she does not have any discomfort associated with her large varicosities of her left leg however she is concerned that if they were to sustain an injury that they would bleed profusely -Left lower extremity reflux study was negative for DVT.  It did show some mild deep venous insufficiency.  Left GSV incompetent but only at the saphenofemoral junction. -Patient would prefer not to wear thigh-high compression.  Given only minimal venous insufficiency I do not think she would be a candidate for laser ablation however we can certainly repeat this imaging study in the future to see if she would qualify for laser ablation and/or stab phlebectomy.  For now she will follow-up as needed.  Conservative recommendations included knee-high compression to be worn daily, elevating her legs periodically during the day, and avoiding prolonged sitting and standing.   Emilie Rutter, PA-C [facility] [phone]  Clinic MD:   Myra Gianotti

## 2022-03-31 ENCOUNTER — Encounter: Payer: Self-pay | Admitting: Physician Assistant

## 2022-04-21 DIAGNOSIS — H25813 Combined forms of age-related cataract, bilateral: Secondary | ICD-10-CM | POA: Diagnosis not present

## 2022-04-21 DIAGNOSIS — D3132 Benign neoplasm of left choroid: Secondary | ICD-10-CM | POA: Diagnosis not present

## 2022-05-17 ENCOUNTER — Ambulatory Visit: Payer: Medicare HMO

## 2022-05-19 ENCOUNTER — Ambulatory Visit (INDEPENDENT_AMBULATORY_CARE_PROVIDER_SITE_OTHER): Payer: Medicare HMO | Admitting: *Deleted

## 2022-05-19 DIAGNOSIS — Z23 Encounter for immunization: Secondary | ICD-10-CM

## 2022-05-19 NOTE — Progress Notes (Signed)
2nd shingles shot given, left deltoid, intramuscular. Patient tolerated well

## 2022-06-21 ENCOUNTER — Other Ambulatory Visit: Payer: Self-pay | Admitting: Family Medicine

## 2022-06-21 DIAGNOSIS — Z1231 Encounter for screening mammogram for malignant neoplasm of breast: Secondary | ICD-10-CM

## 2022-07-19 ENCOUNTER — Encounter: Payer: Medicare HMO | Admitting: Family Medicine

## 2022-07-21 ENCOUNTER — Encounter: Payer: Medicare HMO | Admitting: Family Medicine

## 2022-07-27 ENCOUNTER — Ambulatory Visit (INDEPENDENT_AMBULATORY_CARE_PROVIDER_SITE_OTHER): Payer: Medicare HMO | Admitting: Family Medicine

## 2022-07-27 ENCOUNTER — Encounter: Payer: Self-pay | Admitting: Family Medicine

## 2022-07-27 VITALS — BP 113/67 | HR 60 | Temp 96.5°F | Ht 65.5 in | Wt 164.8 lb

## 2022-07-27 DIAGNOSIS — J309 Allergic rhinitis, unspecified: Secondary | ICD-10-CM

## 2022-07-27 MED ORDER — BENZONATATE 100 MG PO CAPS: 200.0000 mg | ORAL_CAPSULE | Freq: Three times a day (TID) | ORAL | 0 refills | Status: DC | PRN

## 2022-07-27 MED ORDER — FLUTICASONE PROPIONATE 50 MCG/ACT NA SUSP: 2.0000 | Freq: Every day | NASAL | 6 refills | Status: DC

## 2022-07-27 NOTE — Progress Notes (Signed)
Subjective:  Patient ID: Ashlee Maldonado, female    DOB: 1956-01-18, 67 y.o.   MRN: [id number]  Patient Care Team: Gabriel Earing, FNP as PCP - General (Family Medicine)   Chief Complaint:  Cough and Nasal Congestion (Since she started mowing a few weeks ago )   HPI: Ashlee Maldonado is a 67 y.o. female presenting on 07/27/2022 for Cough and Nasal Congestion (Since she started mowing a few weeks ago )   Pt presents today with complaints of cough, rhinitis, sneezing, and nasal congestion. States this all started after she started mowing her lawn. Has has not been using her Flonase or taking her xyzal as prescribed. No fever, chills, weakness, confusion, headaches, or sputum production.   Cough This is a new problem. The current episode started 1 to 4 weeks ago. The problem has been waxing and waning. The problem occurs every few hours. The cough is Non-productive. Associated symptoms include nasal congestion, postnasal drip and rhinorrhea. Pertinent negatives include no chest pain, chills, ear congestion, ear pain, eye redness, fever, headaches, heartburn, hemoptysis, myalgias, rash, sore throat, shortness of breath, sweats, weight loss or wheezing. The symptoms are aggravated by pollens. She has tried nothing for the symptoms.     Relevant past medical, surgical, family, and social history reviewed and updated as indicated.  Allergies and medications reviewed and updated. Data reviewed: Chart in Epic.   Past Medical History:  Diagnosis Date   Osteoporosis     Past Surgical History:  Procedure Laterality Date   BLEPHAROPLASTY Bilateral 10/22/2021   NASAL SINUS SURGERY     TUBAL LIGATION      Social History   Socioeconomic History   Marital status: Divorced    Spouse name: Not on file   Number of children: 2   Years of education: 12   Highest education level: High school graduate  Occupational History   Occupation: retired  Tobacco Use   Smoking status: Former     Packs/day: .5    Types: Cigarettes    Start date: 04/19/1977    Quit date: 04/20/1991    Years since quitting: 31.2   Smokeless tobacco: Never  Vaping Use   Vaping Use: Never used  Substance and Sexual Activity   Alcohol use: Not Currently    Comment: rare   Drug use: No   Sexual activity: Not Currently  Other Topics Concern   Not on file  Social History Narrative   Not on file   Social Determinants of Health   Financial Resource Strain: Low Risk  (01/18/2022)   Overall Financial Resource Strain (CARDIA)    Difficulty of Paying Living Expenses: Not hard at all  Food Insecurity: No Food Insecurity (01/18/2022)   Hunger Vital Sign    Worried About Running Out of Food in the Last Year: Never true    Ran Out of Food in the Last Year: Never true  Transportation Needs: No Transportation Needs (01/18/2022)   PRAPARE - Administrator, Civil Service (Medical): No    Lack of Transportation (Non-Medical): No  Physical Activity: Sufficiently Active (01/18/2022)   Exercise Vital Sign    Days of Exercise per Week: 7 days    Minutes of Exercise per Session: 30 min  Stress: No Stress Concern Present (01/18/2022)   [employer] of Occupational Health - Occupational Stress Questionnaire    Feeling of Stress : Not at all  Social Connections: Moderately Integrated (01/18/2022)   Social Connection and Isolation Panel [  NHANES]    Frequency of Communication with Friends and Family: More than three times a week    Frequency of Social Gatherings with Friends and Family: More than three times a week    Attends Religious Services: More than 4 times per year    Active Member of [employer] or Organizations: Yes    Attends Engineer, structural: More than 4 times per year    Marital Status: Divorced  Intimate Partner Violence: Not At Risk (01/18/2022)   Humiliation, Afraid, Rape, and Kick questionnaire    Fear of Current or Ex-Partner: No    Emotionally Abused: No    Physically Abused: No     Sexually Abused: No    Outpatient Encounter Medications as of 07/27/2022  Medication Sig   alendronate (FOSAMAX) 70 MG tablet Take 1 tablet (70 mg total) by mouth every 7 (seven) days. Take with a full glass of water on an empty stomach.   benzonatate (TESSALON PERLES) 100 MG capsule Take 2 capsules (200 mg total) by mouth 3 (three) times daily as needed for cough.   cholecalciferol (VITAMIN D3) 25 MCG (1000 UNIT) tablet Take 1,000 Units by mouth daily.   fluticasone (FLONASE) 50 MCG/ACT nasal spray Place 2 sprays into both nostrils daily.   levocetirizine (XYZAL) 5 MG tablet Take 1 tablet (5 mg total) by mouth every evening.   [DISCONTINUED] fluticasone (FLONASE) 50 MCG/ACT nasal spray 1 spray into each nostril daily. (Patient not taking: Reported on 07/27/2022)   No facility-administered encounter medications on file as of 07/27/2022.    Allergies  Allergen Reactions   Other Nausea And Vomiting    Says most pain medications cause nausea & vomiting.   Codeine Nausea And Vomiting   Cyclobenzaprine Other (See Comments)    Hives   Meloxicam Hives    Review of Systems  Constitutional:  Negative for activity change, appetite change, chills, diaphoresis, fatigue, fever, unexpected weight change and weight loss.  HENT:  Positive for congestion, postnasal drip, rhinorrhea and sneezing. Negative for dental problem, drooling, ear discharge, ear pain, facial swelling, hearing loss, mouth sores, nosebleeds, sinus pressure, sinus pain, sore throat, tinnitus, trouble swallowing and voice change.   Eyes: Negative.  Negative for photophobia, pain, discharge, redness, itching and visual disturbance.  Respiratory:  Positive for cough. Negative for apnea, hemoptysis, choking, chest tightness, shortness of breath, wheezing and stridor.   Cardiovascular:  Negative for chest pain, palpitations and leg swelling.  Gastrointestinal:  Negative for blood in stool, constipation, diarrhea, heartburn, nausea and  vomiting.  Endocrine: Negative.   Genitourinary:  Negative for dysuria, frequency and urgency.  Musculoskeletal:  Negative for arthralgias and myalgias.  Skin: Negative.  Negative for rash.  Allergic/Immunologic: Negative.   Neurological:  Negative for dizziness, weakness and headaches.  Hematological: Negative.   Psychiatric/Behavioral:  Negative for confusion, hallucinations, sleep disturbance and suicidal ideas.   All other systems reviewed and are negative.       Objective:  BP 113/67   Pulse 60   Temp (!) 96.5 F (35.8 C) (Temporal)   Ht 5' 5.5" (1.664 m)   Wt 164 lb 12.8 oz (74.8 kg)   SpO2 95%   BMI 27.01 kg/m    Wt Readings from Last 3 Encounters:  07/27/22 164 lb 12.8 oz (74.8 kg)  03/29/22 146 lb (66.2 kg)  01/18/22 162 lb 4 oz (73.6 kg)    Physical Exam Vitals and nursing note reviewed.  Constitutional:      General: She is  not in acute distress.    Appearance: Normal appearance. She is overweight. She is not ill-appearing, toxic-appearing or diaphoretic.  HENT:     Head: Normocephalic and atraumatic.     Right Ear: Hearing, ear canal and external ear normal. A middle ear effusion is present. Tympanic membrane is not erythematous.     Left Ear: Hearing, ear canal and external ear normal. A middle ear effusion is present. Tympanic membrane is not erythematous.     Nose: Congestion present.     Right Turbinates: Swollen and pale.     Left Turbinates: Swollen and pale.     Mouth/Throat:     Lips: Pink.     Mouth: Mucous membranes are moist.     Pharynx: Oropharynx is clear. Uvula midline.  Eyes:     General: Lids are normal. Allergic shiner present.     Conjunctiva/sclera: Conjunctivae normal.     Pupils: Pupils are equal, round, and reactive to light.  Cardiovascular:     Rate and Rhythm: Normal rate and regular rhythm.     Heart sounds: Normal heart sounds.  Pulmonary:     Effort: Pulmonary effort is normal.     Breath sounds: Normal breath sounds.   Musculoskeletal:     Cervical back: Neck supple.  Lymphadenopathy:     Cervical: No cervical adenopathy.  Skin:    General: Skin is warm and dry.     Capillary Refill: Capillary refill takes less than 2 seconds.  Neurological:     General: No focal deficit present.     Mental Status: She is alert and oriented to person, place, and time.  Psychiatric:        Mood and Affect: Mood normal.        Behavior: Behavior normal.        Thought Content: Thought content normal.        Judgment: Judgment normal.     Results for orders placed or performed in visit on 07/15/21  CBC with Differential/Platelet  Result Value Ref Range   WBC 4.9 3.4 - 10.8 x10E3/uL   RBC 4.37 3.77 - 5.28 x10E6/uL   Hemoglobin 12.9 11.1 - 15.9 g/dL   Hematocrit 81.138.5 91.434.0 - 46.6 %   MCV 88 79 - 97 fL   MCH 29.5 26.6 - 33.0 pg   MCHC 33.5 31.5 - 35.7 g/dL   RDW 78.212.3 95.611.7 - 21.315.4 %   Platelets 206 150 - 450 x10E3/uL   Neutrophils 39 Not Estab. %   Lymphs 49 Not Estab. %   Monocytes 8 Not Estab. %   Eos 3 Not Estab. %   Basos 1 Not Estab. %   Neutrophils Absolute 1.9 1.4 - 7.0 x10E3/uL   Lymphocytes Absolute 2.4 0.7 - 3.1 x10E3/uL   Monocytes Absolute 0.4 0.1 - 0.9 x10E3/uL   EOS (ABSOLUTE) 0.1 0.0 - 0.4 x10E3/uL   Basophils Absolute 0.1 0.0 - 0.2 x10E3/uL   Immature Granulocytes 0 Not Estab. %   Immature Grans (Abs) 0.0 0.0 - 0.1 x10E3/uL  CMP14+EGFR  Result Value Ref Range   Glucose 90 70 - 99 mg/dL   BUN 24 8 - 27 mg/dL   Creatinine, Ser 0.860.90 0.57 - 1.00 mg/dL   eGFR 71 >57>59 QI/ONG/2.95mL/min/1.73   BUN/Creatinine Ratio 27 12 - 28   Sodium 143 134 - 144 mmol/L   Potassium 4.2 3.5 - 5.2 mmol/L   Chloride 106 96 - 106 mmol/L   CO2 24 20 - 29 mmol/L  Calcium 9.4 8.7 - 10.3 mg/dL   Total Protein 6.0 6.0 - 8.5 g/dL   Albumin 4.1 3.8 - 4.8 g/dL   Globulin, Total 1.9 1.5 - 4.5 g/dL   Albumin/Globulin Ratio 2.2 1.2 - 2.2   Bilirubin Total 0.5 0.0 - 1.2 mg/dL   Alkaline Phosphatase 49 44 - 121 IU/L   AST 15 0 -  40 IU/L   ALT 14 0 - 32 IU/L  Thyroid Panel With TSH  Result Value Ref Range   TSH 1.780 0.450 - 4.500 uIU/mL   T4, Total 5.9 4.5 - 12.0 ug/dL   T3 Uptake Ratio 27 24 - 39 %   Free Thyroxine Index 1.6 1.2 - 4.9  VITAMIN D 25 Hydroxy (Vit-D Deficiency, Fractures)  Result Value Ref Range   Vit D, 25-Hydroxy 31.7 30.0 - 100.0 ng/mL       Pertinent labs & imaging results that were available during my care of the patient were reviewed by me and considered in my medical decision making.  Assessment & Plan:  Ashlee Maldonado was seen today for cough and nasal congestion.  Diagnoses and all orders for this visit:  Chronic allergic rhinitis Aware to take Xyzla as prescribed. Restart flonase. Will add tessalon for cough. Symptomatic care discussed in detail. Aware to report new, worsening, or persistent symptoms.  -     fluticasone (FLONASE) 50 MCG/ACT nasal spray; Place 2 sprays into both nostrils daily. -     benzonatate (TESSALON PERLES) 100 MG capsule; Take 2 capsules (200 mg total) by mouth 3 (three) times daily as needed for cough.     Continue all other maintenance medications.  Follow up plan: Return if symptoms worsen or fail to improve.   Continue healthy lifestyle choices, including diet (rich in fruits, vegetables, and lean proteins, and low in salt and simple carbohydrates) and exercise (at least 30 minutes of moderate physical activity daily).  Educational handout given for allergic rhinitis  The above assessment and management plan was discussed with the patient. The patient verbalized understanding of and has agreed to the management plan. Patient is aware to call the clinic if they develop any new symptoms or if symptoms persist or worsen. Patient is aware when to return to the clinic for a follow-up visit. Patient educated on when it is appropriate to go to the emergency department.   Kari Baars, FNP-C [facility] [phone]

## 2022-08-24 DIAGNOSIS — H5203 Hypermetropia, bilateral: Secondary | ICD-10-CM | POA: Diagnosis not present

## 2022-08-26 ENCOUNTER — Other Ambulatory Visit: Payer: Self-pay | Admitting: Family Medicine

## 2022-08-26 DIAGNOSIS — M810 Age-related osteoporosis without current pathological fracture: Secondary | ICD-10-CM

## 2022-10-13 ENCOUNTER — Ambulatory Visit
Admission: RE | Admit: 2022-10-13 | Discharge: 2022-10-13 | Disposition: A | Discharge status: Home / Self Care | Payer: Medicare HMO | Source: Ambulatory Visit | Attending: Family Medicine | Admitting: Family Medicine

## 2022-10-13 DIAGNOSIS — Z1231 Encounter for screening mammogram for malignant neoplasm of breast: Secondary | ICD-10-CM | POA: Diagnosis not present

## 2022-10-19 ENCOUNTER — Other Ambulatory Visit: Payer: Self-pay | Admitting: Family Medicine

## 2022-10-19 DIAGNOSIS — R928 Other abnormal and inconclusive findings on diagnostic imaging of breast: Secondary | ICD-10-CM

## 2022-10-26 ENCOUNTER — Ambulatory Visit
Admission: RE | Admit: 2022-10-26 | Discharge: 2022-10-26 | Disposition: A | Discharge status: Home / Self Care | Payer: Medicare HMO | Source: Ambulatory Visit | Attending: Family Medicine | Admitting: Family Medicine

## 2022-10-26 DIAGNOSIS — N6325 Unspecified lump in the left breast, overlapping quadrants: Secondary | ICD-10-CM | POA: Diagnosis not present

## 2022-10-26 DIAGNOSIS — R928 Other abnormal and inconclusive findings on diagnostic imaging of breast: Secondary | ICD-10-CM

## 2022-10-27 ENCOUNTER — Other Ambulatory Visit: Payer: Self-pay | Admitting: Family Medicine

## 2022-10-27 DIAGNOSIS — N6320 Unspecified lump in the left breast, unspecified quadrant: Secondary | ICD-10-CM

## 2022-10-29 ENCOUNTER — Ambulatory Visit
Admission: RE | Admit: 2022-10-29 | Discharge: 2022-10-29 | Disposition: A | Discharge status: Home / Self Care | Payer: Medicare HMO | Source: Ambulatory Visit | Attending: Family Medicine | Admitting: Family Medicine

## 2022-10-29 DIAGNOSIS — N6320 Unspecified lump in the left breast, unspecified quadrant: Secondary | ICD-10-CM

## 2022-10-29 DIAGNOSIS — N6032 Fibrosclerosis of left breast: Secondary | ICD-10-CM | POA: Diagnosis not present

## 2022-10-29 DIAGNOSIS — N6325 Unspecified lump in the left breast, overlapping quadrants: Secondary | ICD-10-CM | POA: Diagnosis not present

## 2022-10-29 HISTORY — PX: BREAST BIOPSY: SHX20

## 2022-11-08 ENCOUNTER — Other Ambulatory Visit: Payer: Self-pay | Admitting: Family Medicine

## 2022-11-08 DIAGNOSIS — M810 Age-related osteoporosis without current pathological fracture: Secondary | ICD-10-CM

## 2022-11-19 ENCOUNTER — Ambulatory Visit (INDEPENDENT_AMBULATORY_CARE_PROVIDER_SITE_OTHER): Payer: Medicare HMO | Admitting: Family Medicine

## 2022-11-19 ENCOUNTER — Encounter: Payer: Self-pay | Admitting: Family Medicine

## 2022-11-19 VITALS — BP 96/61 | HR 61 | Temp 98.2°F | Ht 65.5 in | Wt 157.4 lb

## 2022-11-19 DIAGNOSIS — M810 Age-related osteoporosis without current pathological fracture: Secondary | ICD-10-CM

## 2022-11-19 DIAGNOSIS — E559 Vitamin D deficiency, unspecified: Secondary | ICD-10-CM

## 2022-11-19 DIAGNOSIS — R6889 Other general symptoms and signs: Secondary | ICD-10-CM | POA: Diagnosis not present

## 2022-11-19 DIAGNOSIS — Z130 Encounter for screening for diseases of the blood and blood-forming organs and certain disorders involving the immune mechanism: Secondary | ICD-10-CM

## 2022-11-19 DIAGNOSIS — Z13220 Encounter for screening for lipoid disorders: Secondary | ICD-10-CM | POA: Diagnosis not present

## 2022-11-19 DIAGNOSIS — Z1329 Encounter for screening for other suspected endocrine disorder: Secondary | ICD-10-CM | POA: Diagnosis not present

## 2022-11-19 DIAGNOSIS — Z13228 Encounter for screening for other metabolic disorders: Secondary | ICD-10-CM | POA: Diagnosis not present

## 2022-11-19 DIAGNOSIS — Z0001 Encounter for general adult medical examination with abnormal findings: Secondary | ICD-10-CM | POA: Diagnosis not present

## 2022-11-19 DIAGNOSIS — Z0000 Encounter for general adult medical examination without abnormal findings: Secondary | ICD-10-CM

## 2022-11-19 NOTE — Progress Notes (Signed)
Complete physical exam  Patient: Ashlee Maldonado   DOB: [birth year]   67 y.o. Female  MRN: [id number]  Subjective:    Chief Complaint  Patient presents with   Annual Exam    Ashlee Maldonado is a 67 y.o. female who presents today for a complete physical exam. She reports consuming a well balanced diet. Home exercise routine includes yard work, swimming, and dancing. She generally feels well. She reports sleeping well. She does not have additional problems to discuss today.    Most recent fall risk assessment:    11/19/2022    2:12 PM  Fall Risk   Falls in the past year? 0     Most recent depression screenings:    11/19/2022    2:14 PM 01/18/2022   11:30 AM  PHQ 2/9 Scores  PHQ - 2 Score 0 0  PHQ- 9 Score 0 0    Vision:Within last year and Dental: No current dental problems and Receives regular dental care  Past Medical History:  Diagnosis Date   Osteoporosis       Patient Care Team: Gabriel Earing, FNP as PCP - General (Family Medicine)   Outpatient Medications Prior to Visit  Medication Sig   alendronate (FOSAMAX) 70 MG tablet TAKE 1 TABLET (70 MG TOTAL) BY MOUTH EVERY 7 (SEVEN) DAYS. TAKE WITH A FULL GLASS OF WATER ON AN EMPTY STOMACH.   cholecalciferol (VITAMIN D3) 25 MCG (1000 UNIT) tablet Take 1,000 Units by mouth daily.   fluticasone (FLONASE) 50 MCG/ACT nasal spray Place 2 sprays into both nostrils daily.   levocetirizine (XYZAL) 5 MG tablet Take 1 tablet (5 mg total) by mouth every evening.   [DISCONTINUED] benzonatate (TESSALON PERLES) 100 MG capsule Take 2 capsules (200 mg total) by mouth 3 (three) times daily as needed for cough.   No facility-administered medications prior to visit.    ROS Negative unless specially indicated above in HPI.     Objective:     BP 96/61   Pulse 61   Temp 98.2 F (36.8 C) (Temporal)   Ht 5' 5.5" (1.664 m)   Wt 157 lb 6 oz (71.4 kg)   SpO2 96%   BMI 25.79 kg/m  Wt Readings from Last 3 Encounters:  11/19/22 157 lb  6 oz (71.4 kg)  07/27/22 164 lb 12.8 oz (74.8 kg)  03/29/22 146 lb (66.2 kg)      Physical Exam Vitals and nursing note reviewed.  Constitutional:      General: She is not in acute distress.    Appearance: Normal appearance. She is not ill-appearing.  HENT:     Head: Normocephalic.     Right Ear: Tympanic membrane, ear canal and external ear normal.     Left Ear: Tympanic membrane, ear canal and external ear normal.     Nose: Nose normal.     Mouth/Throat:     Mouth: Mucous membranes are moist.     Pharynx: Oropharynx is clear.  Eyes:     Extraocular Movements: Extraocular movements intact.     Conjunctiva/sclera: Conjunctivae normal.     Pupils: Pupils are equal, round, and reactive to light.  Neck:     Thyroid: No thyroid mass, thyromegaly or thyroid tenderness.  Cardiovascular:     Rate and Rhythm: Normal rate and regular rhythm.     Pulses: Normal pulses.     Heart sounds: Normal heart sounds. No murmur heard.    No friction rub. No gallop.  Pulmonary:  Effort: Pulmonary effort is normal.     Breath sounds: Normal breath sounds.  Abdominal:     General: Bowel sounds are normal. There is no distension.     Palpations: Abdomen is soft. There is no mass.     Tenderness: There is no abdominal tenderness. There is no guarding.  Musculoskeletal:        General: No swelling or tenderness. Normal range of motion.     Cervical back: Normal range of motion and neck supple. No tenderness.     Right lower leg: No edema.     Left lower leg: No edema.  Skin:    General: Skin is warm and dry.     Capillary Refill: Capillary refill takes less than 2 seconds.     Findings: No lesion or rash.  Neurological:     General: No focal deficit present.     Mental Status: She is alert and oriented to person, place, and time.     Cranial Nerves: No cranial nerve deficit.     Motor: No weakness.     Gait: Gait normal.  Psychiatric:        Mood and Affect: Mood normal.         Behavior: Behavior normal.        Thought Content: Thought content normal.        Judgment: Judgment normal.      No results found for any visits on 11/19/22.     Assessment & Plan:    Routine Health Maintenance and Physical Exam  Seara was seen today for annual exam.  Diagnoses and all orders for this visit:  Routine general medical examination at a health care facility  Vitamin D deficiency On repletion therapy. Labs pending.  -     VITAMIN D 25 Hydroxy (Vit-D Deficiency, Fractures)  Age-related osteoporosis without current pathological fracture On fosamax, calcium, vit D, and weight bearing exercise. DEXA done 2023  Screening for endocrine, metabolic and immunity disorder -     CMP14+EGFR -     CBC with Differential/Platelet -     TSH  Encounter for screening for lipid disorder -     Lipid panel    Immunization History  Administered Date(s) Administered   Fluad Quad(high Dose 65+) 01/18/2022   Influenza,inj,Quad PF,6+ Mos 03/07/2015, 01/29/2016, 01/22/2019   Influenza,inj,quad, With Preservative 01/29/2016   Influenza-Unspecified 01/15/2020, 02/10/2021   Moderna Sars-Covid-2 Vaccination 07/07/2019, 08/11/2019   PFIZER(Purple Top)SARS-COV-2 Vaccination 03/07/2020, 10/16/2020   PNEUMOCOCCAL CONJUGATE-20 07/15/2021   PPD Test 01/29/2016   Tdap 10/24/2018   Zoster Recombinant(Shingrix) 02/18/2022, 05/19/2022    Health Maintenance  Topic Date Due   INFLUENZA VACCINE  07/18/2023 (Originally 11/18/2022)   COVID-19 Vaccine (5 - 2023-24 season) 12/05/2023 (Originally 12/18/2021)   Medicare Annual Wellness (AWV)  01/19/2023   MAMMOGRAM  10/13/2023   DTaP/Tdap/Td (2 - Td or Tdap) 10/23/2028   Colonoscopy  07/08/2029   Pneumonia Vaccine 73+ Years old  Completed   DEXA SCAN  Completed   Hepatitis C Screening  Completed   Zoster Vaccines- Shingrix  Completed   HPV VACCINES  Aged Out    Discussed health benefits of physical activity, and encouraged her to engage in  regular exercise appropriate for her age and condition.  Problem List Items Addressed This Visit       Other   Vitamin D deficiency   Relevant Orders   VITAMIN D 25 Hydroxy (Vit-D Deficiency, Fractures)   Other Visit Diagnoses  Routine general medical examination at a health care facility    -  Primary   Age-related osteoporosis without current pathological fracture       Screening for endocrine, metabolic and immunity disorder       Relevant Orders   CMP14+EGFR   CBC with Differential/Platelet   TSH   Encounter for screening for lipid disorder       Relevant Orders   Lipid panel      Return in 1 year (on 11/19/2023).   The patient indicates understanding of these issues and agrees with the plan.  Gabriel Earing, FNP

## 2022-11-19 NOTE — Patient Instructions (Signed)

## 2022-12-01 DIAGNOSIS — H524 Presbyopia: Secondary | ICD-10-CM | POA: Diagnosis not present

## 2023-02-23 ENCOUNTER — Ambulatory Visit (INDEPENDENT_AMBULATORY_CARE_PROVIDER_SITE_OTHER): Payer: Medicare HMO

## 2023-02-23 VITALS — Ht 65.0 in | Wt 157.0 lb

## 2023-02-23 DIAGNOSIS — Z0000 Encounter for general adult medical examination without abnormal findings: Secondary | ICD-10-CM

## 2023-02-23 NOTE — Patient Instructions (Signed)
Ashlee Maldonado , Thank you for taking time to come for your Medicare Wellness Visit. I appreciate your ongoing commitment to your health goals. Please review the following plan we discussed and let me know if I can assist you in the future.   Referrals/Orders/Follow-Ups/Clinician Recommendations: Aim for 30 minutes of exercise or brisk walking, 6-8 glasses of water, and 5 servings of fruits and vegetables each day.   This is a list of the screening recommended for you and due dates:  Health Maintenance  Topic Date Due   COVID-19 Vaccine (5 - 2023-24 season) 12/19/2022   Flu Shot  07/18/2023*   Mammogram  10/13/2023   Medicare Annual Wellness Visit  02/23/2024   DTaP/Tdap/Td vaccine (2 - Td or Tdap) 10/23/2028   Colon Cancer Screening  07/08/2029   Pneumonia Vaccine  Completed   DEXA scan (bone density measurement)  Completed   Hepatitis C Screening  Completed   Zoster (Shingles) Vaccine  Completed   HPV Vaccine  Aged Out  *Topic was postponed. The date shown is not the original due date.    Advanced directives: (Provided) Advance directive discussed with you today. I have provided a copy for you to complete at home and have notarized. Once this is complete, please bring a copy in to our office so we can scan it into your chart. Information on Advanced Care Planning can be found at [facility] [place] Advance Health Care Directives Advance Health Care Directives ([email or web address]    Next Medicare Annual Wellness Visit scheduled for next year: Yes  Insert Preventive Care attachment Insert FALL PREVENTION attachment if needed

## 2023-02-23 NOTE — Progress Notes (Signed)
Subjective:   Ashlee Maldonado is a 67 y.o. female who presents for an Initial Medicare Annual Wellness Visit.  Visit Complete: Virtual I connected with  Ashlee Maldonado on 02/23/23 by a audio enabled telemedicine application and verified that I am speaking with the correct person using two identifiers.  Patient Location: Home  Provider Location: Home Office  I discussed the limitations of evaluation and management by telemedicine. The patient expressed understanding and agreed to proceed.  Vital Signs: Because this visit was a virtual/telehealth visit, some criteria may be missing or patient reported. Any vitals not documented were not able to be obtained and vitals that have been documented are patient reported.  Patient Medicare AWV questionnaire was completed by the patient on 02/23/2023; I have confirmed that all information answered by patient is correct and no changes since this date.  Cardiac Risk Factors include: advanced age (>70men, >87 women)     Objective:    Today's Vitals   02/23/23 1003  Weight: 157 lb (71.2 kg)  Height: 5\' 5"  (1.651 m)   Body mass index is 26.13 kg/m.     02/23/2023   10:07 AM 01/18/2022   11:29 AM 09/28/2018    5:30 PM  Advanced Directives  Does Patient Have a Medical Advance Directive? No No No  Would patient like information on creating a medical advance directive? Yes (MAU/Ambulatory/Procedural Areas - Information given) Yes (MAU/Ambulatory/Procedural Areas - Information given)     Current Medications (verified) Outpatient Encounter Medications as of 02/23/2023  Medication Sig   alendronate (FOSAMAX) 70 MG tablet TAKE 1 TABLET (70 MG TOTAL) BY MOUTH EVERY 7 (SEVEN) DAYS. TAKE WITH A FULL GLASS OF WATER ON AN EMPTY STOMACH.   cholecalciferol (VITAMIN D3) 25 MCG (1000 UNIT) tablet Take 1,000 Units by mouth daily.   fluticasone (FLONASE) 50 MCG/ACT nasal spray Place 2 sprays into both nostrils daily.   levocetirizine (XYZAL) 5 MG tablet Take 1  tablet (5 mg total) by mouth every evening.   No facility-administered encounter medications on file as of 02/23/2023.    Allergies (verified) Other, Codeine, Cyclobenzaprine, and Meloxicam   History: Past Medical History:  Diagnosis Date   Osteoporosis    Past Surgical History:  Procedure Laterality Date   BLEPHAROPLASTY Bilateral 10/22/2021   BREAST BIOPSY Left 10/29/2022   [place] LT BREAST BX W LOC DEV 1ST LESION IMG BX SPEC US GUIDE 10/29/2022 [facility]   NASAL SINUS SURGERY     TUBAL LIGATION     Family History  Problem Relation Age of Onset   Cancer Mother    Heart disease Father    Heart disease Sister    Hyperlipidemia Sister    Breast cancer Neg Hx    Social History   Socioeconomic History   Marital status: Divorced    Spouse name: Not on file   Number of children: 2   Years of education: 12   Highest education level: High school graduate  Occupational History   Occupation: retired  Tobacco Use   Smoking status: Former    Current packs/day: 0.00    Average packs/day: 0.5 packs/day for 14.0 years (7.0 ttl pk-yrs)    Types: Cigarettes    Start date: 04/19/1977    Quit date: 04/20/1991    Years since quitting: 31.8   Smokeless tobacco: Never  Vaping Use   Vaping status: Never Used  Substance and Sexual Activity   Alcohol use: Not Currently    Comment: rare   Drug use: No  Sexual activity: Not Currently  Other Topics Concern   Not on file  Social History Narrative   Not on file   Social Determinants of Health   Financial Resource Strain: Low Risk  (02/23/2023)   Overall Financial Resource Strain (CARDIA)    Difficulty of Paying Living Expenses: Not hard at all  Food Insecurity: No Food Insecurity (02/23/2023)   Hunger Vital Sign    Worried About Running Out of Food in the Last Year: Never true    Ran Out of Food in the Last Year: Never true  Transportation Needs: No Transportation Needs (02/23/2023)   PRAPARE - Scientist, research (physical sciences) (Medical): No    Lack of Transportation (Non-Medical): No  Physical Activity: Sufficiently Active (02/23/2023)   Exercise Vital Sign    Days of Exercise per Week: 5 days    Minutes of Exercise per Session: 30 min  Stress: No Stress Concern Present (02/23/2023)   [employer] of Occupational Health - Occupational Stress Questionnaire    Feeling of Stress : Not at all  Social Connections: Moderately Isolated (02/23/2023)   Social Connection and Isolation Panel [NHANES]    Frequency of Communication with Friends and Family: More than three times a week    Frequency of Social Gatherings with Friends and Family: More than three times a week    Attends Religious Services: More than 4 times per year    Active Member of [employer] or Organizations: No    Attends Engineer, structural: Never    Marital Status: Divorced    Tobacco Counseling Counseling given: Not Answered   Clinical Intake:  Pre-visit preparation completed: Yes  Pain : No/denies pain     Nutritional Risks: None Diabetes: No  How often do you need to have someone help you when you read instructions, pamphlets, or other written materials from your doctor or pharmacy?: 1 - Never  Interpreter Needed?: No  Information entered by :: Renie Ora, LPN   Activities of Daily Living    02/23/2023   10:07 AM  In your present state of health, do you have any difficulty performing the following activities:  Hearing? 0  Vision? 0  Difficulty concentrating or making decisions? 0  Walking or climbing stairs? 0  Dressing or bathing? 0  Doing errands, shopping? 0  Preparing Food and eating ? N  Using the Toilet? N  In the past six months, have you accidently leaked urine? N  Do you have problems with loss of bowel control? N  Managing your Medications? N  Managing your Finances? N  Housekeeping or managing your Housekeeping? N    Patient Care Team: Gabriel Earing, FNP as PCP - General (Family  Medicine)  Indicate any recent Medical Services you may have received from other than [facility] providers in the past year (date may be approximate).     Assessment:   This is a routine wellness examination for [name].  Hearing/Vision screen Vision Screening - Comments:: Wears rx glasses - up to date with routine eye exams with  Dr.[email or web address]   Goals Addressed             This Visit's Progress    DIET - EAT MORE FRUITS AND VEGETABLES         Depression Screen    02/23/2023   10:05 AM 11/19/2022    2:14 PM 01/18/2022   11:30 AM 07/15/2021    9:16 AM 06/09/2020    2:50 PM 04/30/2019  11:16 AM 10/24/2018   10:13 AM  PHQ 2/9 Scores  PHQ - 2 Score 0 0 0 0 0 0 0  PHQ- 9 Score  0 0 0       Fall Risk    02/23/2023   10:04 AM 11/19/2022    2:12 PM 01/18/2022   11:30 AM 07/15/2021    9:16 AM 04/30/2019   11:16 AM  Fall Risk   Falls in the past year? 0 0 0 0 0  Number falls in past yr: 0      Injury with Fall? 0      Risk for fall due to : No Fall Risks      Follow up Falls prevention discussed        MEDICARE RISK AT HOME: Medicare Risk at Home Any stairs in or around the home?: Yes If so, are there any without handrails?: No Home free of loose throw rugs in walkways, pet beds, electrical cords, etc?: Yes Adequate lighting in your home to reduce risk of falls?: Yes Life alert?: No Use of a cane, walker or w/c?: No Grab bars in the bathroom?: Yes Shower chair or bench in shower?: Yes Elevated toilet seat or a handicapped toilet?: Yes  TIMED UP AND GO:  Was the test performed? No    Cognitive Function:    01/18/2022   11:37 AM  MMSE - Mini Mental State Exam  Orientation to time 5  Orientation to Place 5  Registration 3  Attention/ Calculation 5  Recall 2  Language- name 2 objects 2  Language- repeat 1  Language- follow 3 step command 3  Language- read & follow direction 1  Write a sentence 1  Copy design 1  Total score 29        02/23/2023   10:07 AM  6CIT  Screen  What Year? 0 points  What month? 0 points  What time? 0 points  Count back from 20 0 points  Months in reverse 0 points  Repeat phrase 0 points  Total Score 0 points    Immunizations Immunization History  Administered Date(s) Administered   Fluad Quad(high Dose 65+) 01/18/2022   Influenza,inj,Quad PF,6+ Mos 03/07/2015, 01/29/2016, 01/22/2019   Influenza,inj,quad, With Preservative 01/29/2016   Influenza-Unspecified 01/15/2020, 02/10/2021   Moderna Sars-Covid-2 Vaccination 07/07/2019, 08/11/2019   PFIZER(Purple Top)SARS-COV-2 Vaccination 03/07/2020, 10/16/2020   PNEUMOCOCCAL CONJUGATE-20 07/15/2021   PPD Test 01/29/2016   Tdap 10/24/2018   Zoster Recombinant(Shingrix) 02/18/2022, 05/19/2022    TDAP status: Up to date  Flu Vaccine status: Due, Education has been provided regarding the importance of this vaccine. Advised may receive this vaccine at local pharmacy or Health Dept. Aware to provide a copy of the vaccination record if obtained from local pharmacy or Health Dept. Verbalized acceptance and understanding.  Pneumococcal vaccine status: Up to date  Covid-19 vaccine status: Completed vaccines  Qualifies for Shingles Vaccine? Yes   Zostavax completed Yes   Shingrix Completed?: Yes  Screening Tests Health Maintenance  Topic Date Due   COVID-19 Vaccine (5 - 2023-24 season) 12/19/2022   INFLUENZA VACCINE  07/18/2023 (Originally 11/18/2022)   MAMMOGRAM  10/13/2023   Medicare Annual Wellness (AWV)  02/23/2024   DTaP/Tdap/Td (2 - Td or Tdap) 10/23/2028   Colonoscopy  07/08/2029   Pneumonia Vaccine 57+ Years old  Completed   DEXA SCAN  Completed   Hepatitis C Screening  Completed   Zoster Vaccines- Shingrix  Completed   HPV VACCINES  Aged Out  Health Maintenance  Health Maintenance Due  Topic Date Due   COVID-19 Vaccine (5 - 2023-24 season) 12/19/2022    Colorectal cancer screening: Type of screening: Colonoscopy. Completed 07/09/2019. Repeat every 10  years  Mammogram status: Completed 10/13/2022. Repeat every year  Bone Density status: Completed 07/16/2021. Results reflect: Bone density results: OSTEOPOROSIS. Repeat every 2 years.  Lung Cancer Screening: (Low Dose CT Chest recommended if Age 47-80 years, 20 pack-year currently smoking OR have quit w/in 15years.) does not qualify.   Lung Cancer Screening Referral: n/a  Additional Screening:  Hepatitis C Screening: does not qualify; Completed 09/04/2014  Vision Screening: Recommended annual ophthalmology exams for early detection of glaucoma and other disorders of the eye. Is the patient up to date with their annual eye exam?  Yes  Who is the provider or what is the name of the office in which the patient attends annual eye exams? Dr.[email or web address] If pt is not established with a provider, would they like to be referred to a provider to establish care? No .   Dental Screening: Recommended annual dental exams for proper oral hygiene   Community Resource Referral / Chronic Care Management: CRR required this visit?  No   CCM required this visit?  No     Plan:     I have personally reviewed and noted the following in the patient's chart:   Medical and social history Use of alcohol, tobacco or illicit drugs  Current medications and supplements including opioid prescriptions. Patient is not currently taking opioid prescriptions. Functional ability and status Nutritional status Physical activity Advanced directives List of other physicians Hospitalizations, surgeries, and ER visits in previous 12 months Vitals Screenings to include cognitive, depression, and falls Referrals and appointments  In addition, I have reviewed and discussed with patient certain preventive protocols, quality metrics, and best practice recommendations. A written personalized care plan for preventive services as well as general preventive health recommendations were provided to patient.     Lorrene Reid, LPN   29/08/6211   After Visit Summary: (MyChart) Due to this being a telephonic visit, the after visit summary with patients personalized plan was offered to patient via MyChart   Nurse Notes: none

## 2023-03-07 ENCOUNTER — Ambulatory Visit (INDEPENDENT_AMBULATORY_CARE_PROVIDER_SITE_OTHER): Payer: Medicare HMO

## 2023-03-07 DIAGNOSIS — Z23 Encounter for immunization: Secondary | ICD-10-CM

## 2023-06-08 ENCOUNTER — Ambulatory Visit (INDEPENDENT_AMBULATORY_CARE_PROVIDER_SITE_OTHER): Payer: Medicare HMO | Admitting: Family Medicine

## 2023-06-08 ENCOUNTER — Encounter: Payer: Self-pay | Admitting: Family Medicine

## 2023-06-08 VITALS — BP 109/61 | HR 62 | Temp 98.5°F | Ht 65.0 in | Wt 162.4 lb

## 2023-06-08 DIAGNOSIS — B349 Viral infection, unspecified: Secondary | ICD-10-CM

## 2023-06-08 DIAGNOSIS — R051 Acute cough: Secondary | ICD-10-CM | POA: Diagnosis not present

## 2023-06-08 LAB — VERITOR FLU A/B WAIVED
Influenza A: NEGATIVE
Influenza B: NEGATIVE

## 2023-06-08 MED ORDER — BENZONATATE 100 MG PO CAPS
100.0000 mg | ORAL_CAPSULE | Freq: Two times a day (BID) | ORAL | 0 refills | Status: DC | PRN
Start: 2023-06-08 — End: 2023-11-21

## 2023-06-08 MED ORDER — FLUTICASONE PROPIONATE 50 MCG/ACT NA SUSP
2.0000 | Freq: Every day | NASAL | 6 refills | Status: DC
Start: 2023-06-08 — End: 2023-08-30

## 2023-06-08 NOTE — Progress Notes (Signed)
 Acute Office Visit  Subjective:     Patient ID: Ashlee Maldonado, female    DOB: [birth year], 68 y.o.   MRN: [id number]  Chief Complaint  Patient presents with   Cough    Cough This is a new problem. Episode onset: 3 days. The problem has been gradually improving. The cough is Productive of sputum (clear). Associated symptoms include ear pain, headaches, nasal congestion and rhinorrhea. Pertinent negatives include no chills, fever, myalgias, sore throat, shortness of breath or wheezing. She has tried prescription cough suppressant and OTC cough suppressant (decongestants) for the symptoms. The treatment provided mild relief. There is no history of asthma.  Sick contacts- unsure of what they had though.   Review of Systems  Constitutional:  Negative for chills and fever.  HENT:  Positive for ear pain and rhinorrhea. Negative for sore throat.   Respiratory:  Positive for cough. Negative for shortness of breath and wheezing.   Musculoskeletal:  Negative for myalgias.  Neurological:  Positive for headaches.        Objective:    BP 109/61   Pulse 62   Temp 98.5 F (36.9 C) (Oral)   Ht 5\' 5"  (1.651 m)   Wt 162 lb 6.4 oz (73.7 kg)   SpO2 95%   BMI 27.02 kg/m    Physical Exam Vitals and nursing note reviewed.  Constitutional:      General: She is not in acute distress.    Appearance: She is ill-appearing. She is not toxic-appearing or diaphoretic.  HENT:     Head: Normocephalic and atraumatic.     Right Ear: Ear canal and external ear normal. A middle ear effusion is present. Tympanic membrane is not perforated, erythematous, retracted or bulging.     Left Ear: Ear canal and external ear normal. A middle ear effusion is present. Tympanic membrane is not perforated, erythematous, retracted or bulging.     Nose: Congestion present.     Mouth/Throat:     Mouth: Mucous membranes are moist.     Pharynx: Oropharynx is clear. No oropharyngeal exudate or posterior oropharyngeal  erythema.     Tonsils: No tonsillar exudate or tonsillar abscesses. 1+ on the right. 1+ on the left.  Eyes:     General:        Right eye: No discharge.        Left eye: No discharge.     Conjunctiva/sclera: Conjunctivae normal.  Cardiovascular:     Rate and Rhythm: Normal rate and regular rhythm.     Heart sounds: No murmur heard. Pulmonary:     Effort: Pulmonary effort is normal. No respiratory distress.     Breath sounds: Normal breath sounds. No wheezing, rhonchi or rales.  Musculoskeletal:     Cervical back: Neck supple. No rigidity.     Right lower leg: No edema.     Left lower leg: No edema.  Lymphadenopathy:     Cervical: No cervical adenopathy.  Skin:    General: Skin is warm and dry.  Neurological:     General: No focal deficit present.     Mental Status: She is alert and oriented to person, place, and time.  Psychiatric:        Mood and Affect: Mood normal.        Behavior: Behavior normal.     No results found for any visits on 06/08/23.      Assessment & Plan:   Elienai was seen today for cough.  Diagnoses and all  orders for this visit:  Viral illness Negative rapid flu today. Covid/flu/RSV swab pending. Discussed symptomatic care and return precautions.  -     Veritor Flu A/B Waived -     fluticasone (FLONASE) 50 MCG/ACT nasal spray; Place 2 sprays into both nostrils daily. -     benzonatate (TESSALON) 100 MG capsule; Take 1 capsule (100 mg total) by mouth 2 (two) times daily as needed for cough. -     COVID-19, Flu A+B and RSV  Return if symptoms worsen or fail to improve.  The patient indicates understanding of these issues and agrees with the plan.  Gabriel Earing, FNP

## 2023-06-10 LAB — COVID-19, FLU A+B AND RSV
Influenza A, NAA: NOT DETECTED
Influenza B, NAA: NOT DETECTED
RSV, NAA: NOT DETECTED
SARS-CoV-2, NAA: NOT DETECTED

## 2023-08-30 ENCOUNTER — Other Ambulatory Visit: Payer: Self-pay | Admitting: *Deleted

## 2023-08-30 ENCOUNTER — Other Ambulatory Visit: Payer: Self-pay | Admitting: Family Medicine

## 2023-08-30 DIAGNOSIS — M810 Age-related osteoporosis without current pathological fracture: Secondary | ICD-10-CM

## 2023-08-30 DIAGNOSIS — B349 Viral infection, unspecified: Secondary | ICD-10-CM

## 2023-08-30 MED ORDER — FLUTICASONE PROPIONATE 50 MCG/ACT NA SUSP
2.0000 | Freq: Every day | NASAL | 1 refills | Status: DC
Start: 2023-08-30 — End: 2024-01-25

## 2023-09-28 ENCOUNTER — Telehealth: Payer: Self-pay | Admitting: Family Medicine

## 2023-09-28 NOTE — Telephone Encounter (Signed)
 Pt seen 10/13/2022 for screening mammo. She had a dx mammo 10/26/2022. Pt add order for pt get a dx mammo-send the the [facility].  Copied from CRM [id number]. Topic: Appointments - Scheduling Inquiry for Clinic >> Sep 28, 2023 10:39 AM [name] wrote: Reason for CRM: Patient received a letter from breast center stating it is time for mammogram. Had two last year 6/26 and July the 9th. Patient does not want to go to [place]. Would like to have on [facility] bus and would like to go ahead and get scheduled. Thank You

## 2023-09-28 NOTE — Telephone Encounter (Signed)
 Unable to call [facility] at this time to find out information - I will call Monday when I am back in the office. Called and notified the patient

## 2023-09-28 NOTE — Telephone Encounter (Signed)
 Ashlee Maldonado can pt do her mammogram on the mobil since she had the abnormal. Recommendations were to return to annual screening?

## 2023-10-03 NOTE — Telephone Encounter (Signed)
 Called and spoke to [facility] and was advised that as lon as she is not having any new issues or changes, she is fine to have screening done on the mobile unit. Called patient and made appt for 10/12/2023.

## 2023-10-12 ENCOUNTER — Encounter

## 2023-10-19 ENCOUNTER — Encounter

## 2023-10-31 ENCOUNTER — Other Ambulatory Visit: Payer: Self-pay | Admitting: Family Medicine

## 2023-10-31 ENCOUNTER — Ambulatory Visit
Admission: RE | Admit: 2023-10-31 | Discharge: 2023-10-31 | Disposition: A | Discharge status: Home / Self Care | Source: Ambulatory Visit | Attending: Family Medicine | Admitting: Family Medicine

## 2023-10-31 DIAGNOSIS — Z1231 Encounter for screening mammogram for malignant neoplasm of breast: Secondary | ICD-10-CM

## 2023-11-21 ENCOUNTER — Ambulatory Visit: Payer: Medicare HMO | Admitting: Family Medicine

## 2023-11-21 ENCOUNTER — Encounter: Payer: Self-pay | Admitting: Family Medicine

## 2023-11-21 VITALS — BP 109/63 | HR 54 | Temp 98.0°F | Ht 65.0 in | Wt 156.6 lb

## 2023-11-21 DIAGNOSIS — Z0001 Encounter for general adult medical examination with abnormal findings: Secondary | ICD-10-CM

## 2023-11-21 DIAGNOSIS — Z130 Encounter for screening for diseases of the blood and blood-forming organs and certain disorders involving the immune mechanism: Secondary | ICD-10-CM

## 2023-11-21 DIAGNOSIS — E782 Mixed hyperlipidemia: Secondary | ICD-10-CM | POA: Diagnosis not present

## 2023-11-21 DIAGNOSIS — Z1329 Encounter for screening for other suspected endocrine disorder: Secondary | ICD-10-CM | POA: Diagnosis not present

## 2023-11-21 DIAGNOSIS — Z13228 Encounter for screening for other metabolic disorders: Secondary | ICD-10-CM | POA: Diagnosis not present

## 2023-11-21 DIAGNOSIS — E559 Vitamin D deficiency, unspecified: Secondary | ICD-10-CM

## 2023-11-21 DIAGNOSIS — Z0000 Encounter for general adult medical examination without abnormal findings: Secondary | ICD-10-CM

## 2023-11-21 LAB — LIPID PANEL

## 2023-11-21 NOTE — Progress Notes (Signed)
 Complete physical exam  Patient: Ashlee Maldonado   DOB: Jan 13, 1956   68 y.o. Female  MRN: [id number]  Subjective:    Chief Complaint  Patient presents with   Annual Exam    Ashlee Maldonado is a 68 y.o. female who presents today for a complete physical exam. She reports consuming a general diet. She is doing stair climer and piliates 3-4x a week. She also push mows her yard. She generally feels fairly well. 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 This has been stressful for Ashlee Maldonado and she has not been sleeping well because of it. She has been crying frequency. She has been praying, talking with her preacher, and socializing when able to help. She does not have additional problems to discuss today.   She sometimes has pain in her right ear. This ear also sometimes pops.   Most recent fall risk assessment:    11/21/2023    2:06 PM  Fall Risk   Falls in the past year? 0     Most recent depression screenings:    11/21/2023    2:06 PM 06/08/2023    9:29 AM 02/23/2023   10:05 AM  Depression screen PHQ 2/9  Decreased Interest 0 0 0  Down, Depressed, Hopeless 0 0 0  PHQ - 2 Score 0 0 0  Altered sleeping 1 1   Tired, decreased energy 0 1   Change in appetite 0 1   Feeling bad or failure about yourself  0 0   Trouble concentrating 0 0   Moving slowly or fidgety/restless 0 0   Suicidal thoughts 0 0   PHQ-9 Score 1 3   Difficult doing work/chores Not difficult at all Not difficult at all      Vision:Within last year and Dental: No current dental problems and Receives regular dental care  Past Medical History:  Diagnosis Date   Osteoporosis       Patient Care Team: Joesph Annabella HERO, FNP as PCP - General (Family Medicine)   Outpatient Medications Prior to Visit  Medication Sig   alendronate  (FOSAMAX ) 70 MG tablet TAKE 1 TABLET (70 MG TOTAL) BY MOUTH EVERY 7 (SEVEN) DAYS. TAKE WITH A FULL GLASS OF WATER  ON AN EMPTY STOMACH.   cholecalciferol (VITAMIN D3) 25 MCG (1000 UNIT) tablet Take 1,000 Units by mouth daily.   fluticasone  (FLONASE ) 50 MCG/ACT nasal spray Place 2 sprays into both nostrils daily.   levocetirizine (XYZAL ) 5 MG tablet Take 1 tablet (5 mg total) by mouth every evening.   [DISCONTINUED] benzonatate  (TESSALON ) 100 MG capsule Take 1 capsule (100 mg total) by mouth 2 (two) times daily as needed for cough.   No facility-administered medications prior to visit.    ROS Negative unless specially indicated above in HPI.     Objective:     BP 109/63   Pulse (!) 54   Temp 98 F (36.7 C) (Temporal)   Ht 5' 5 (1.651 m)   Wt 156 lb 9.6 oz (71 kg)   SpO2 95%   BMI 26.06 kg/m    Physical Exam Vitals and nursing note reviewed.  Constitutional:      General: She is not in acute distress.    Appearance: Normal appearance. She is not ill-appearing, toxic-appearing or diaphoretic.  HENT:     Head: Normocephalic.     Right Ear: Tympanic membrane, ear canal and external ear normal.  Left Ear: Tympanic membrane, ear canal and external ear normal.     Nose: Nose normal.     Mouth/Throat:     Mouth: Mucous membranes are moist.     Pharynx: Oropharynx is clear.  Eyes:     Extraocular Movements: Extraocular movements intact.     Conjunctiva/sclera: Conjunctivae normal.     Pupils: Pupils are equal, round, and reactive to light.  Neck:     Thyroid : No thyroid  mass, thyromegaly or thyroid  tenderness.  Cardiovascular:     Rate and Rhythm: Normal rate and regular rhythm.     Pulses: Normal pulses.     Heart sounds: Normal heart sounds. No murmur heard.    No friction rub. No gallop.  Pulmonary:     Effort: Pulmonary effort is normal.     Breath sounds: Normal breath sounds.  Abdominal:     General: Bowel sounds are normal. There is no distension.     Palpations: Abdomen is soft. There is no mass.     Tenderness: There is no abdominal tenderness. There is no guarding.   Musculoskeletal:     Cervical back: Normal range of motion and neck supple. No tenderness.     Right lower leg: No edema.     Left lower leg: No edema.  Skin:    General: Skin is warm and dry.     Capillary Refill: Capillary refill takes less than 2 seconds.     Findings: No lesion or rash.  Neurological:     General: No focal deficit present.     Mental Status: She is alert and oriented to person, place, and time.     Cranial Nerves: No cranial nerve deficit.     Motor: No weakness.     Gait: Gait normal.  Psychiatric:        Mood and Affect: Mood normal.        Behavior: Behavior normal.        Thought Content: Thought content normal.        Judgment: Judgment normal.      No results found for any visits on 11/21/23.     Assessment & Plan:    Routine Health Maintenance and Physical Exam  [name] was seen today for annual exam.  Diagnoses and all orders for this visit:  Routine general medical examination at a health care facility  Mixed hyperlipidemia Labs pending.  -     Lipid panel  Vitamin D  deficiency -     Vitamin D , 25-hydroxy  Screening for endocrine, metabolic and immunity disorder -     CBC with Differential/Platelet -     CMP14+EGFR -     TSH    Immunization History  Administered Date(s) Administered   Fluad Quad(high Dose 65+) 01/18/2022   Fluad Trivalent(High Dose 65+) 03/07/2023   Influenza,inj,Quad PF,6+ Mos 03/07/2015, 01/29/2016, 01/22/2019   Influenza,inj,quad, With Preservative 01/29/2016   Influenza-Unspecified 01/15/2020, 02/10/2021   Moderna Sars-Covid-2 Vaccination 07/07/2019, 08/11/2019   PFIZER(Purple Top)SARS-COV-2 Vaccination 03/07/2020, 10/16/2020   PNEUMOCOCCAL CONJUGATE-20 07/15/2021   PPD Test 01/29/2016   Tdap 10/24/2018   Zoster Recombinant(Shingrix ) 02/18/2022, 05/19/2022    Health Maintenance  Topic Date Due   INFLUENZA VACCINE  07/17/2024 (Originally 11/18/2023)   COVID-19 Vaccine (5 - 2024-25 season) 12/06/2024  (Originally 12/19/2022)   Medicare Annual Wellness (AWV)  02/23/2024   MAMMOGRAM  10/30/2024   DTaP/Tdap/Td (2 - Td or Tdap) 10/23/2028   Colonoscopy  07/08/2029   Pneumococcal Vaccine: 50+ Years  Completed  DEXA SCAN  Completed   Hepatitis C Screening  Completed   Zoster Vaccines- Shingrix   Completed   Hepatitis B Vaccines  Aged Out   HPV VACCINES  Aged Out   Meningococcal B Vaccine  Aged Out    Discussed health benefits of physical activity, and encouraged her to engage in regular exercise appropriate for her age and condition.  Problem List Items Addressed This Visit       Other   Vitamin D  deficiency   Relevant Orders   Vitamin D , 25-hydroxy   Other Visit Diagnoses       Routine general medical examination at a health care facility    -  Primary     Mixed hyperlipidemia       Relevant Orders   Lipid panel     Screening for endocrine, metabolic and immunity disorder       Relevant Orders   CBC with Differential/Platelet   CMP14+EGFR   TSH      Return in 1 year (on 11/20/2024).   The patient indicates understanding of these issues and agrees with the plan.  Annabella CHRISTELLA Search, FNP

## 2023-11-21 NOTE — Patient Instructions (Signed)

## 2023-11-22 LAB — CBC WITH DIFFERENTIAL/PLATELET
Basophils Absolute: 0 x10E3/uL (ref 0.0–0.2)
Basos: 1 %
EOS (ABSOLUTE): 0.1 x10E3/uL (ref 0.0–0.4)
Eos: 3 %
Hematocrit: 39.1 % (ref 34.0–46.6)
Hemoglobin: 12.5 g/dL (ref 11.1–15.9)
Immature Grans (Abs): 0 x10E3/uL (ref 0.0–0.1)
Immature Granulocytes: 0 %
Lymphocytes Absolute: 2 x10E3/uL (ref 0.7–3.1)
Lymphs: 43 %
MCH: 29.5 pg (ref 26.6–33.0)
MCHC: 32 g/dL (ref 31.5–35.7)
MCV: 92 fL (ref 79–97)
Monocytes Absolute: 0.4 x10E3/uL (ref 0.1–0.9)
Monocytes: 9 %
Neutrophils Absolute: 2 x10E3/uL (ref 1.4–7.0)
Neutrophils: 44 %
Platelets: 201 x10E3/uL (ref 150–450)
RBC: 4.24 x10E6/uL (ref 3.77–5.28)
RDW: 12.5 % (ref 11.7–15.4)
WBC: 4.6 x10E3/uL (ref 3.4–10.8)

## 2023-11-22 LAB — CMP14+EGFR
ALT: 9 IU/L (ref 0–32)
AST: 13 IU/L (ref 0–40)
Albumin: 4.1 g/dL (ref 3.9–4.9)
Alkaline Phosphatase: 32 IU/L — AB (ref 44–121)
BUN/Creatinine Ratio: 19 (ref 12–28)
BUN: 15 mg/dL (ref 8–27)
Bilirubin Total: 0.6 mg/dL (ref 0.0–1.2)
CO2: 22 mmol/L (ref 20–29)
Calcium: 9.4 mg/dL (ref 8.7–10.3)
Chloride: 104 mmol/L (ref 96–106)
Creatinine, Ser: 0.81 mg/dL (ref 0.57–1.00)
Globulin, Total: 2.1 g/dL (ref 1.5–4.5)
Glucose: 86 mg/dL (ref 70–99)
Potassium: 4.6 mmol/L (ref 3.5–5.2)
Sodium: 138 mmol/L (ref 134–144)
Total Protein: 6.2 g/dL (ref 6.0–8.5)
eGFR: 80 mL/min/1.73 (ref >59)

## 2023-11-22 LAB — VITAMIN D 25 HYDROXY (VIT D DEFICIENCY, FRACTURES): Vit D, 25-Hydroxy: 59.3 ng/mL (ref 30.0–100.0)

## 2023-11-22 LAB — LIPID PANEL
Cholesterol, Total: 196 mg/dL (ref 100–199)
HDL: 64 mg/dL (ref >39)
LDL CALC COMMENT:: 3.1 ratio (ref 0.0–4.4)
LDL Chol Calc (NIH): 122 mg/dL — AB (ref 0–99)
Triglycerides: 51 mg/dL (ref 0–149)
VLDL Cholesterol Cal: 10 mg/dL (ref 5–40)

## 2023-11-22 LAB — TSH: TSH: 1.39 u[IU]/mL (ref 0.450–4.500)

## 2023-11-23 ENCOUNTER — Ambulatory Visit: Payer: Self-pay | Admitting: Family Medicine

## 2023-12-08 ENCOUNTER — Telehealth: Payer: Self-pay

## 2023-12-08 NOTE — Telephone Encounter (Signed)
 Copied from CRM [id number]. Topic: Clinical - Lab/Test Results >> Dec 08, 2023 12:00 PM Teressa P wrote: Reason for CRM: pt would like a nurse to call her back regarding her labs.  [phone]

## 2023-12-08 NOTE — Telephone Encounter (Signed)
 Reviewed labs with pt and recommendations and pt voiced understanding.

## 2024-01-18 DIAGNOSIS — H25813 Combined forms of age-related cataract, bilateral: Secondary | ICD-10-CM | POA: Diagnosis not present

## 2024-01-25 ENCOUNTER — Other Ambulatory Visit: Payer: Self-pay | Admitting: Family Medicine

## 2024-01-25 DIAGNOSIS — B349 Viral infection, unspecified: Secondary | ICD-10-CM

## 2024-02-23 ENCOUNTER — Ambulatory Visit

## 2024-02-24 ENCOUNTER — Ambulatory Visit (INDEPENDENT_AMBULATORY_CARE_PROVIDER_SITE_OTHER): Payer: Self-pay

## 2024-02-24 DIAGNOSIS — Z23 Encounter for immunization: Secondary | ICD-10-CM | POA: Diagnosis not present

## 2024-02-29 ENCOUNTER — Other Ambulatory Visit: Payer: Self-pay | Admitting: *Deleted

## 2024-02-29 DIAGNOSIS — M810 Age-related osteoporosis without current pathological fracture: Secondary | ICD-10-CM

## 2024-04-26 ENCOUNTER — Ambulatory Visit

## 2024-04-26 VITALS — BP 109/63 | HR 54 | Ht 65.0 in | Wt 156.0 lb

## 2024-04-26 DIAGNOSIS — Z0000 Encounter for general adult medical examination without abnormal findings: Secondary | ICD-10-CM | POA: Diagnosis not present

## 2024-04-26 DIAGNOSIS — Z13820 Encounter for screening for osteoporosis: Secondary | ICD-10-CM

## 2024-04-26 NOTE — Progress Notes (Signed)
 "  Chief Complaint  Patient presents with   Medicare Wellness     Subjective:   Ashlee Maldonado is a 69 y.o. female who presents for a Medicare Annual Wellness Visit.  Visit info / Clinical Intake: Medicare Wellness Visit Type:: Subsequent Annual Wellness Visit Persons participating in visit and providing information:: patient Medicare Wellness Visit Mode:: Telephone If telephone:: video declined Since this visit was completed virtually, some vitals may be partially provided or unavailable. Missing vitals are due to the limitations of the virtual format.: Unable to obtain vitals - no equipment If Telephone or Video please confirm:: I connected with patient using audio/video enable telemedicine. I verified patient identity with two identifiers, discussed telehealth limitations, and patient agreed to proceed. Patient Location:: home Provider Location:: office Interpreter Needed?: No Pre-visit prep was completed: yes AWV questionnaire completed by patient prior to visit?: no Living arrangements:: (!) lives alone Patient's Overall Health Status Rating: very good Typical amount of pain: none Does pain affect daily life?: no Are you currently prescribed opioids?: no  Dietary Habits and Nutritional Risks How many meals a day?: 3 Eats fruit and vegetables daily?: yes Most meals are obtained by: preparing own meals In the last 2 weeks, have you had any of the following?: none Diabetic:: no  Functional Status Activities of Daily Living (to include ambulation/medication): Independent Ambulation: Independent Medication Administration: Independent Home Management (perform basic housework or laundry): Independent Manage your own finances?: yes Primary transportation is: driving Concerns about vision?: no *vision screening is required for WT[facility]* (last ov 2025/Dr. Hervey in [place]) Concerns about hearing?: no  Fall Screening Falls in the past year?: 0 Number of falls in past year: 0 Was  there an injury with Fall?: 0 Fall Risk Category Calculator: 0 Patient Fall Risk Level: Low Fall Risk  Fall Risk Patient at Risk for Falls Due to: No Fall Risks Fall risk Follow up: Falls evaluation completed; Education provided  Home and Transportation Safety: All rugs have non-skid backing?: yes All stairs or steps have railings?: yes Grab bars in the bathtub or shower?: (!) no Have non-skid surface in bathtub or shower?: yes Good home lighting?: yes Regular seat belt use?: yes Hospital stays in the last year:: no  Cognitive Assessment Difficulty concentrating, remembering, or making decisions? : no Will 6CIT or Mini Cog be Completed: yes What year is it?: 0 points What month is it?: 0 points Give patient an address phrase to remember (5 components): [address] [place] what time is it?: 0 points Count backwards from 20 to 1: 0 points Say the months of the year in reverse: 0 points Repeat the address phrase from earlier: 0 points 6 CIT Score: 0 points  Advance Directives (For Healthcare) Does Patient Have a Medical Advance Directive?: No Would patient like information on creating a medical advance directive?: No - Patient declined  Reviewed/Updated  Reviewed/Updated: Reviewed All (Medical, Surgical, Family, Medications, Allergies, Care Teams, Patient Goals); Medical History; Surgical History; Family History; Medications; Allergies; Care Teams; Patient Goals    Allergies (verified) Other, Codeine, Cyclobenzaprine , and Meloxicam   Current Medications (verified) Outpatient Encounter Medications as of 04/26/2024  Medication Sig   alendronate  (FOSAMAX ) 70 MG tablet TAKE 1 TABLET EVERY 7 DAYS. TAKE WITH A FULL GLASS OF WATER ON AN EMPTY STOMACH.   cholecalciferol (VITAMIN D3) 25 MCG (1000 UNIT) tablet Take 1,000 Units by mouth daily.   fluticasone  (FLONASE ) 50 MCG/ACT nasal spray USE 2 SPRAYS IN EACH NOSTRIL EVERY DAY  levocetirizine (XYZAL ) 5 MG tablet Take 1  tablet (5 mg total) by mouth every evening.   No facility-administered encounter medications on file as of 04/26/2024.    History: Past Medical History:  Diagnosis Date   Osteoporosis    Past Surgical History:  Procedure Laterality Date   BLEPHAROPLASTY Bilateral 10/22/2021   BREAST BIOPSY Left 10/29/2022   US  LT BREAST BX W LOC DEV 1ST LESION IMG BX SPEC US  GUIDE 10/29/2022 [facility] MAMMOGRAP[facility]   NASAL SINUS SURGERY     TUBAL LIGATION     Family History  Problem Relation Age of Onset   Cancer Mother    Heart disease Father    Heart disease Sister    Hyperlipidemia Sister    Breast cancer Neg Hx    Social History   Occupational History   Occupation: retired  Tobacco Use   Smoking status: Former    Current packs/day: 0.00    Average packs/day: 0.5 packs/day for 14.0 years (7.0 ttl pk-yrs)    Types: Cigarettes    Start date: 04/19/1977    Quit date: 04/20/1991    Years since quitting: 33.0   Smokeless tobacco: Never  Vaping Use   Vaping status: Never Used  Substance and Sexual Activity   Alcohol use: Not Currently    Comment: rare   Drug use: No   Sexual activity: Not Currently   Tobacco Counseling Counseling given: Yes  SDOH Screenings   Food Insecurity: No Food Insecurity (04/26/2024)  Housing: Unknown (04/26/2024)  Transportation Needs: No Transportation Needs (04/26/2024)  Utilities: Not At Risk (04/26/2024)  Alcohol Screen: Low Risk (02/23/2023)  Depression (PHQ2-9): Low Risk (04/26/2024)  Financial Resource Strain: Low Risk (02/23/2023)  Physical Activity: Sufficiently Active (02/23/2023)  Social Connections: Moderately Isolated (04/26/2024)  Stress: No Stress Concern Present (04/26/2024)  Tobacco Use: Medium Risk (04/26/2024)  Health Literacy: Adequate Health Literacy (04/26/2024)   See flowsheets for full screening details  Depression Screen PHQ 2 & 9 Depression Scale- Over the past 2 weeks, how often have you been bothered by any of the following problems? Little interest  or pleasure in doing things: 0 Feeling down, depressed, or hopeless (PHQ Adolescent also includes...irritable): 0 PHQ-2 Total Score: 0 Trouble falling or staying asleep, or sleeping too much: 1 Feeling tired or having little energy: 0 Poor appetite or overeating (PHQ Adolescent also includes...weight loss): 0 Feeling bad about yourself - or that you are a failure or have let yourself or your family down: 0 Trouble concentrating on things, such as reading the newspaper or watching television (PHQ Adolescent also includes...like school work): 0 Moving or speaking so slowly that other people could have noticed. Or the opposite - being so fidgety or restless that you have been moving around a lot more than usual: 0 Thoughts that you would be better off dead, or of hurting yourself in some way: 0 PHQ-9 Total Score: 1 If you checked off any problems, how difficult have these problems made it for you to do your work, take care of things at home, or get along with other people?: Not difficult at all  Depression Treatment Depression Interventions/Treatment : [id number] Score <4 Follow-up Not Indicated     Goals Addressed             This Visit's Progress    Stay Active and Independent               Objective:    Today's Vitals   04/26/24 1059  BP: 109/63  Pulse: (!) 54  Weight: 156 lb (70.8 kg)  Height: 5' 5 (1.651 m)   Body mass index is 25.96 kg/m.  Hearing/Vision screen No results found. Immunizations and Health Maintenance Health Maintenance  Topic Date Due   Bone Density Scan  07/17/2023   COVID-19 Vaccine (5 - 2025-26 season) 12/19/2023   Mammogram  10/30/2024   Medicare Annual Wellness (AWV)  04/26/2025   DTaP/Tdap/Td (2 - Td or Tdap) 10/23/2028   Colonoscopy  07/08/2029   Pneumococcal Vaccine: 50+ Years  Completed   Influenza Vaccine  Completed   Hepatitis C Screening  Completed   Zoster Vaccines- Shingrix   Completed   Meningococcal B Vaccine  Aged Out         Assessment/Plan:  This is a routine wellness examination for Ashlee Maldonado.  Patient Care Team: Joesph Annabella HERO, FNP as PCP - General (Family Medicine)  I have personally reviewed and noted the following in the patients chart:   Medical and social history Use of alcohol, tobacco or illicit drugs  Current medications and supplements including opioid prescriptions. Functional ability and status Nutritional status Physical activity Advanced directives List of other physicians Hospitalizations, surgeries, and ER visits in previous 12 months Vitals Screenings to include cognitive, depression, and falls Referrals and appointments  Orders Placed This Encounter  Procedures   DG [facility] DEXA    Pls schedule at [facility], call pt to let her know    Standing Status:   Future    Expiration Date:   04/26/2025    Reason for Exam (SYMPTOM  OR DIAGNOSIS REQUIRED):   bone density   In addition, I have reviewed and discussed with patient certain preventive protocols, quality metrics, and best practice recommendations. A written personalized care plan for preventive services as well as general preventive health recommendations were provided to patient.   Ozie Ned, CMA   04/26/2024   Return in 1 year (on 04/26/2025).  After Visit Summary: (MyChart) Due to this being a telephonic visit, the after visit summary with patients personalized plan was offered to patient via MyChart   Nurse Notes: HM Addressed: DEXA ordered/pt declined getting Covid vaccine.  "

## 2024-04-26 NOTE — Patient Instructions (Signed)
 Ms. Greenly,  Thank you for taking the time for your Medicare Wellness Visit. I appreciate your continued commitment to your health goals. Please review the care plan we discussed, and feel free to reach out if I can assist you further.  Please note that Annual Wellness Visits do not include a physical exam. Some assessments may be limited, especially if the visit was conducted virtually. If needed, we may recommend an in-person follow-up with your provider.  Ongoing Care Seeing your primary care provider every 3 to 6 months helps us  monitor your health and provide consistent, personalized care.   Referrals If a referral was made during today's visit and you haven't received any updates within two weeks, please contact the referred provider directly to check on the status.  Recommended Screenings:  Health Maintenance  Topic Date Due   Osteoporosis screening with Bone Density Scan  07/17/2023   COVID-19 Vaccine (5 - 2025-26 season) 12/19/2023   Medicare Annual Wellness Visit  02/23/2024   Breast Cancer Screening  10/30/2024   DTaP/Tdap/Td vaccine (2 - Td or Tdap) 10/23/2028   Colon Cancer Screening  07/08/2029   Pneumococcal Vaccine for age over 52  Completed   Flu Shot  Completed   Hepatitis C Screening  Completed   Zoster (Shingles) Vaccine  Completed   Meningitis B Vaccine  Aged Out       04/26/2024   10:49 AM  Advanced Directives  Does Patient Have a Medical Advance Directive? No  Would patient like information on creating a medical advance directive? No - Patient declined    Vision: Annual vision screenings are recommended for early detection of glaucoma, cataracts, and diabetic retinopathy. These exams can also reveal signs of chronic conditions such as diabetes and high blood pressure.  Dental: Annual dental screenings help detect early signs of oral cancer, gum disease, and other conditions linked to overall health, including heart disease and diabetes.  Please see the  attached documents for additional preventive care recommendations.

## 2024-11-21 ENCOUNTER — Other Ambulatory Visit

## 2024-11-21 ENCOUNTER — Encounter: Payer: Self-pay | Admitting: Family Medicine

## 2025-04-29 ENCOUNTER — Ambulatory Visit
